# Patient Record
Sex: Female | Born: 1985 | Hispanic: Yes | Marital: Married | Smoking: Never smoker
Health system: Southern US, Community
[De-identification: ages and names within clinical notes are randomized; demographics above are authoritative.]

## PROBLEM LIST (undated history)

## (undated) DIAGNOSIS — E559 Vitamin D deficiency, unspecified: Secondary | ICD-10-CM

## (undated) DIAGNOSIS — K429 Umbilical hernia without obstruction or gangrene: Secondary | ICD-10-CM

---
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
## 2007-01-20 ENCOUNTER — Ambulatory Visit: Payer: Self-pay

## 2007-04-09 IMAGING — US US OB US >=[ID] SNGL FETUS
1 series · 17 of 18 positions shown · non-contrast
Comparison: none

REASON FOR EXAM: Dates
COMMENTS:

[Series 1: us ob us >=(id) sngl fetus · 17 of 18 slices shown]
[im 1/18]
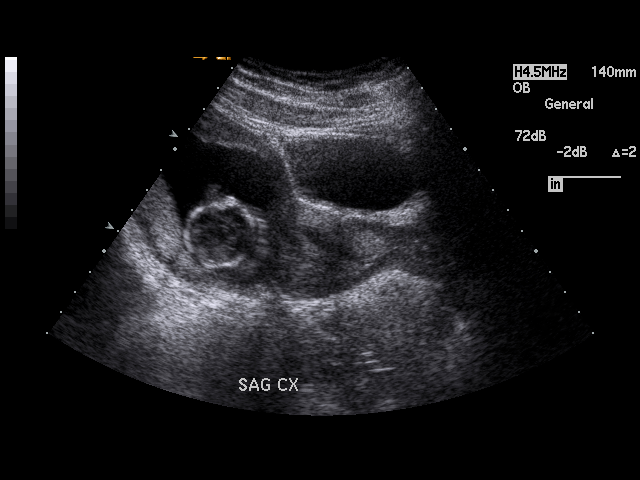
[im 2/18]
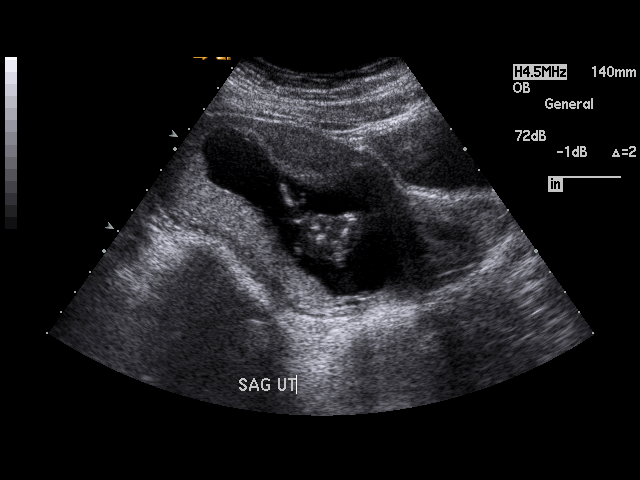
[im 3/18]
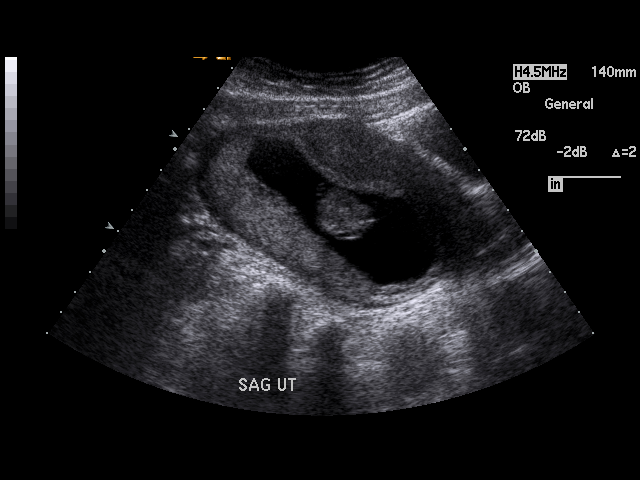
[im 4/18]
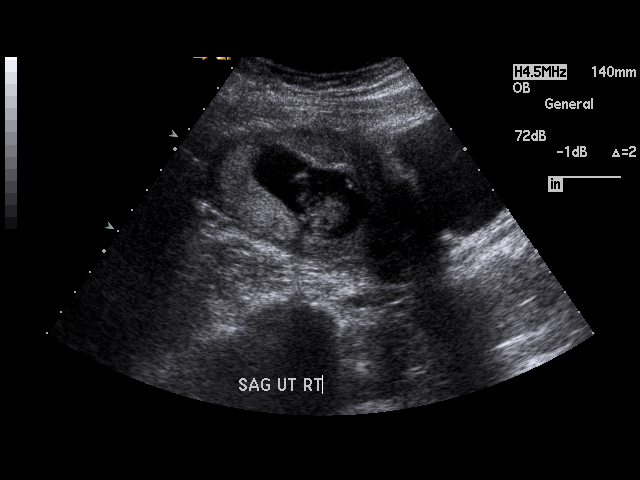
[im 5/18]
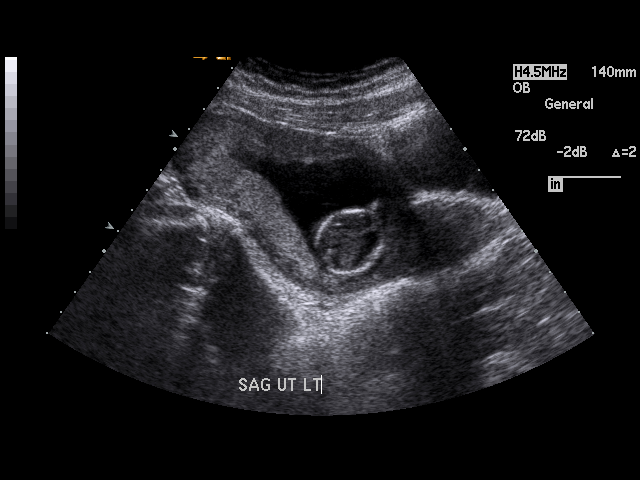
[im 6/18]
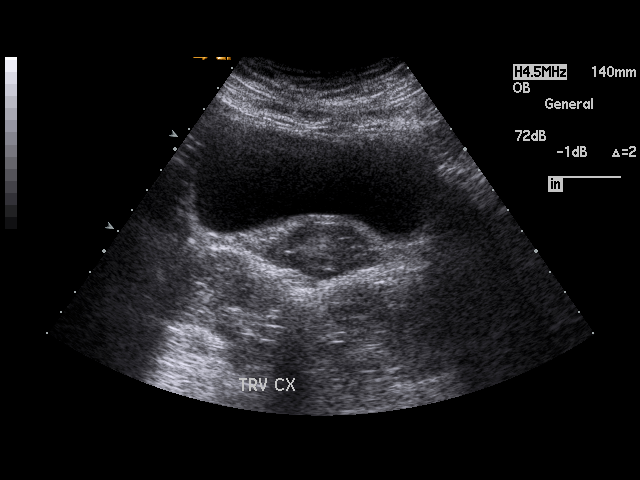
[im 7/18]
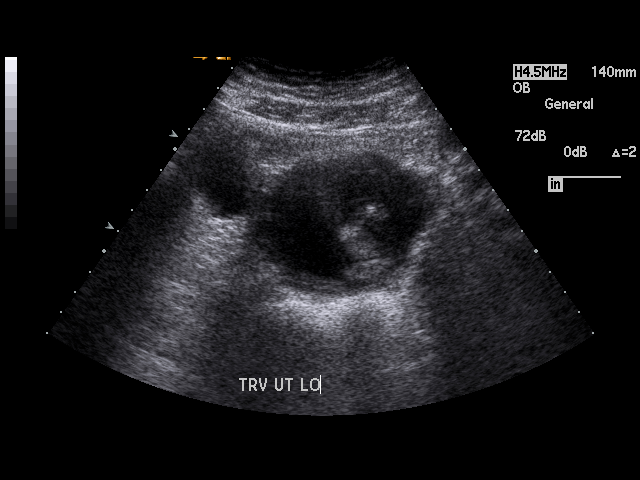
[im 8/18]
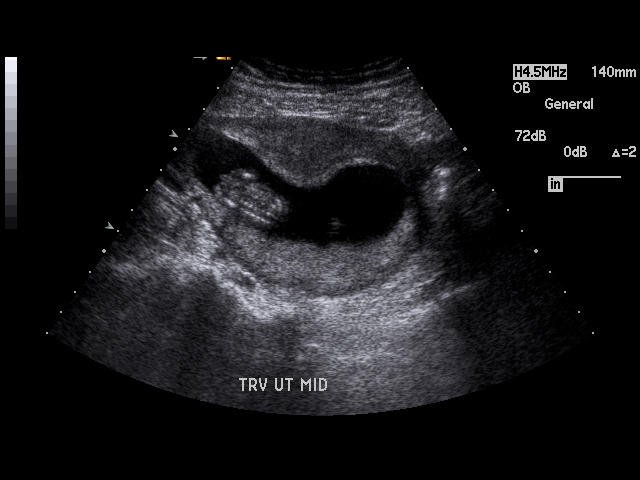
[im 10/18]
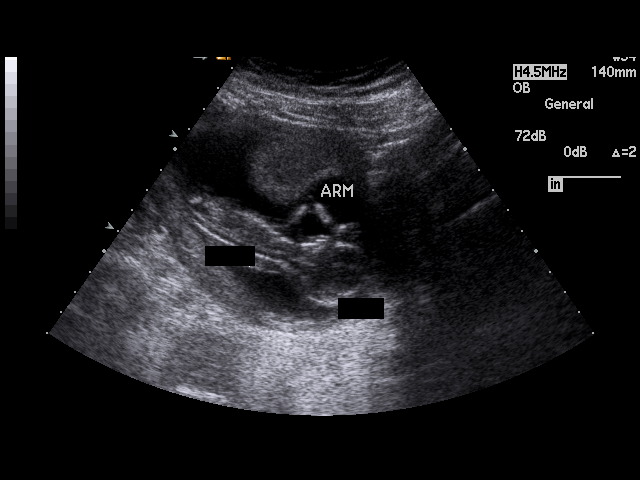
[im 11/18]
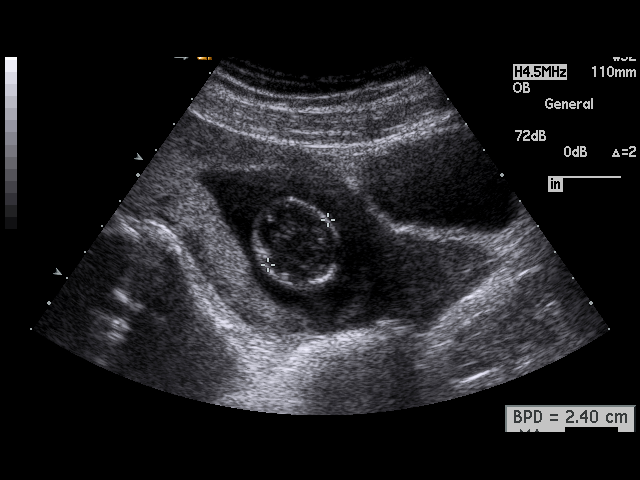
[im 12/18]
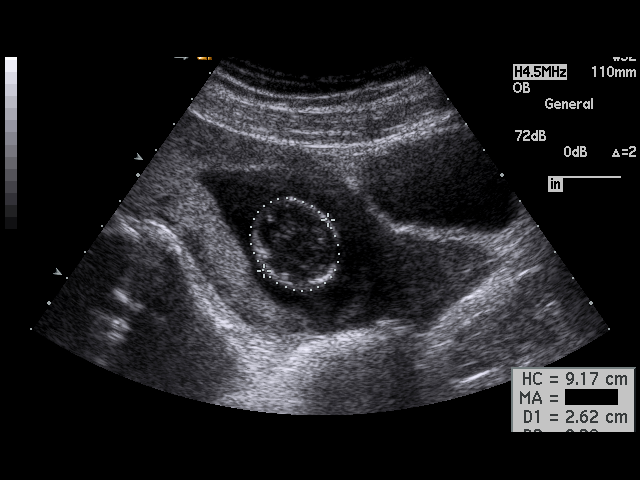
[im 13/18]
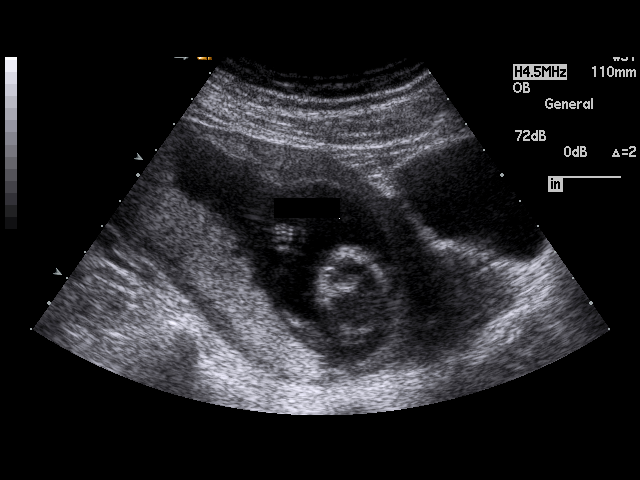
[im 14/18]
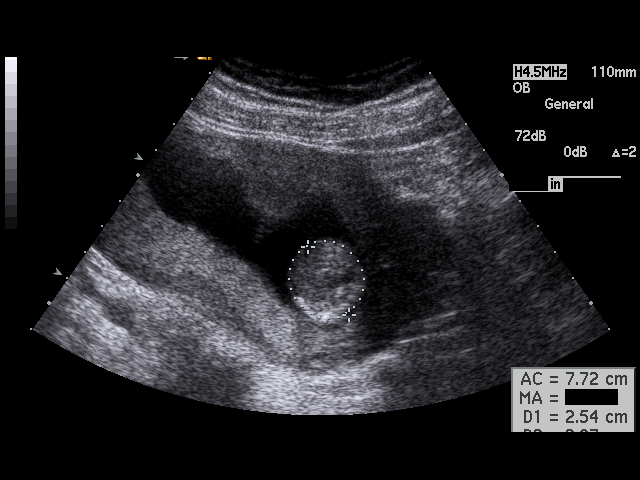
[im 15/18]
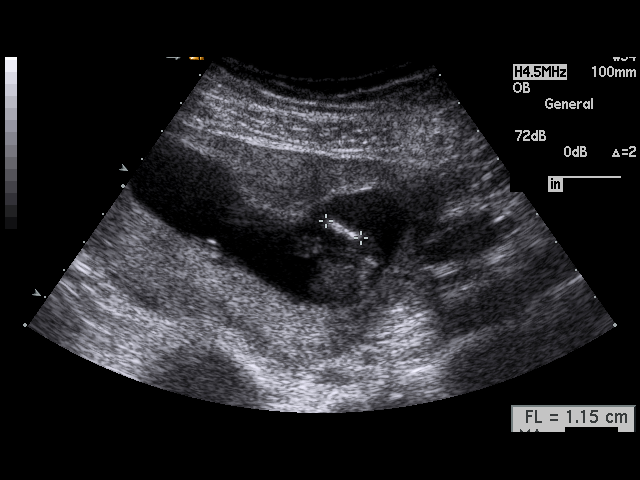
[im 16/18]
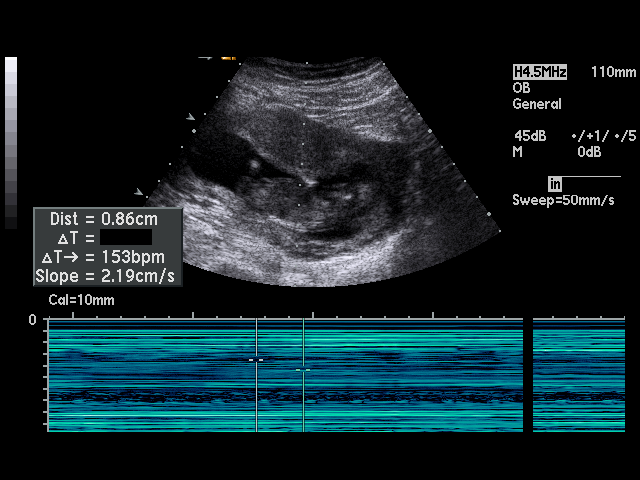
[im 17/18]
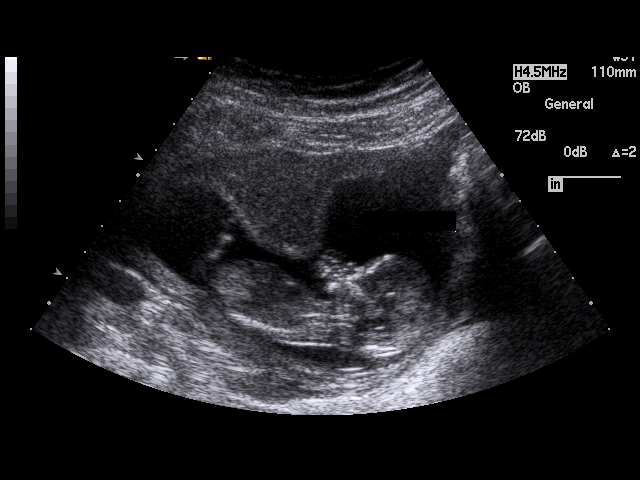
[im 18/18]
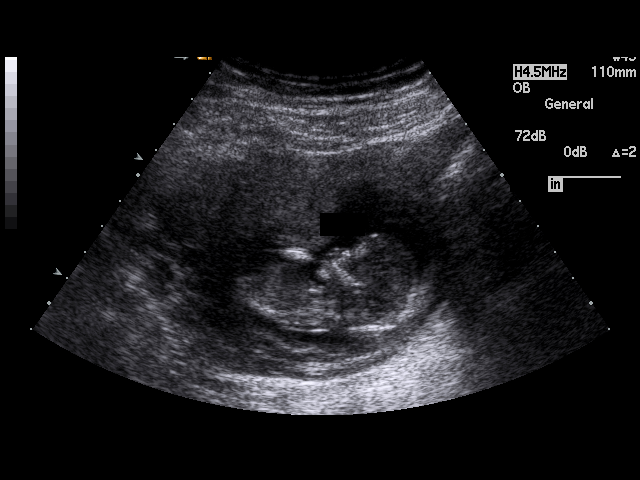

[17 of 18 positions shown; findings below may reference images not displayed]

PROCEDURE:     US  - US PREGNANCY / FETAL AGE  - November [birth year], 2004  [DATE]

RESULT:     There is observed a viable intrauterine gestation.  Fetal parts
are too small to evaluate at this time.  The embryo heart rate was monitored
at 153 beats per minute.

The fetal measurements are as follows:
BPD 2.40 cm (14 weeks 0 days)
HC 9.17 cm (14 weeks 1 day)
AC 7.72 cm (14 weeks 1 day)
FL 1.15 cm (13 weeks 3 days)
EFW equal 102 grams.
Ultrasound EDD is 05-15-05.
IMPRESSION: Please see above.

## 2012-03-14 ENCOUNTER — Emergency Department: Payer: Self-pay | Admitting: Emergency Medicine

## 2012-03-14 LAB — URINALYSIS, COMPLETE
Bacteria: NONE SEEN
Bilirubin,UR: NEGATIVE
Glucose,UR: NEGATIVE mg/dL (ref 0–75)
Leukocyte Esterase: NEGATIVE
Nitrite: NEGATIVE
Squamous Epithelial: <1
WBC UR: NONE SEEN /HPF (ref 0–5)

## 2012-03-14 LAB — COMPREHENSIVE METABOLIC PANEL
Albumin: 4.1 g/dL (ref 3.4–5.0)
Alkaline Phosphatase: 87 U/L (ref 50–136)
BUN: 11 mg/dL (ref 7–18)
Calcium, Total: 8.7 mg/dL (ref 8.5–10.1)
Co2: 26 mmol/L (ref 21–32)
Creatinine: 0.76 mg/dL (ref 0.60–1.30)
Osmolality: 280 (ref 275–301)
SGOT(AST): 27 U/L (ref 15–37)

## 2012-03-14 LAB — CBC
HGB: 13.6 g/dL (ref 12.0–16.0)
MCH: 33.1 pg (ref 26.0–34.0)
MCHC: 34.6 g/dL (ref 32.0–36.0)
Platelet: 290 10*3/uL (ref 150–440)
RBC: 4.11 10*6/uL (ref 3.80–5.20)
RDW: 12.3 % (ref 11.5–14.5)

## 2012-03-14 LAB — HCG, QUANTITATIVE, PREGNANCY: Beta Hcg, Quant.: 24 m[IU]/mL — ABNORMAL HIGH

## 2012-03-14 LAB — PREGNANCY, URINE: Pregnancy Test, Urine: NEGATIVE m[IU]/mL

## 2012-09-30 ENCOUNTER — Emergency Department: Payer: Self-pay | Admitting: Emergency Medicine

## 2012-09-30 LAB — URINALYSIS, COMPLETE
Glucose,UR: NEGATIVE mg/dL (ref 0–75)
Ketone: NEGATIVE
Leukocyte Esterase: NEGATIVE
Specific Gravity: 1.018 (ref 1.003–1.030)
Squamous Epithelial: 4
WBC UR: 1 /HPF (ref 0–5)

## 2012-09-30 LAB — WET PREP, GENITAL

## 2012-09-30 LAB — COMPREHENSIVE METABOLIC PANEL
Alkaline Phosphatase: 81 U/L (ref 50–136)
BUN: 14 mg/dL (ref 7–18)
Chloride: 109 mmol/L — ABNORMAL HIGH (ref 98–107)
EGFR (African American): >60
EGFR (Non-African Amer.): >60
Glucose: 89 mg/dL (ref 65–99)
Osmolality: 274 (ref 275–301)
Sodium: 137 mmol/L (ref 136–145)
Total Protein: 7.6 g/dL (ref 6.4–8.2)

## 2012-09-30 LAB — CBC
MCH: 32.9 pg (ref 26.0–34.0)
MCV: 93 fL (ref 80–100)
Platelet: 286 10*3/uL (ref 150–440)
RBC: 4.52 10*6/uL (ref 3.80–5.20)
RDW: 12.9 % (ref 11.5–14.5)
WBC: 13.2 10*3/uL — ABNORMAL HIGH (ref 3.6–11.0)

## 2012-09-30 LAB — GC/CHLAMYDIA PROBE AMP

## 2013-11-06 ENCOUNTER — Ambulatory Visit: Payer: Self-pay | Admitting: Family Medicine

## 2014-03-26 ENCOUNTER — Observation Stay: Payer: Self-pay | Admitting: Obstetrics and Gynecology

## 2014-04-07 ENCOUNTER — Inpatient Hospital Stay: Payer: Self-pay | Admitting: Obstetrics and Gynecology

## 2014-04-07 ENCOUNTER — Observation Stay: Payer: Self-pay | Admitting: Obstetrics and Gynecology

## 2014-04-07 LAB — CBC WITH DIFFERENTIAL/PLATELET
BASOS ABS: 0 10*3/uL (ref 0.0–0.1)
Basophil %: 0.4 %
EOS ABS: 0.1 10*3/uL (ref 0.0–0.7)
Eosinophil %: 0.7 %
HCT: 39.6 % (ref 35.0–47.0)
HGB: 13.1 g/dL (ref 12.0–16.0)
LYMPHS ABS: 2.3 10*3/uL (ref 1.0–3.6)
Lymphocyte %: 18.9 %
MCH: 31 pg (ref 26.0–34.0)
MCHC: 33.2 g/dL (ref 32.0–36.0)
MCV: 94 fL (ref 80–100)
MONO ABS: 0.7 x10 3/mm (ref 0.2–0.9)
Monocyte %: 5.9 %
Neutrophil #: 9.1 10*3/uL — ABNORMAL HIGH (ref 1.4–6.5)
Neutrophil %: 74.1 %
PLATELETS: 177 10*3/uL (ref 150–440)
RBC: 4.23 10*6/uL (ref 3.80–5.20)
RDW: 14.5 % (ref 11.5–14.5)
WBC: 12.2 10*3/uL — ABNORMAL HIGH (ref 3.6–11.0)

## 2014-04-09 LAB — HEMOGLOBIN: HGB: 12.4 g/dL (ref 12.0–16.0)

## 2014-08-09 ENCOUNTER — Emergency Department
Admit: 2014-08-09 | Disposition: A | Discharge status: Home / Self Care | Payer: Self-pay | Admitting: Emergency Medicine

## 2015-06-03 ENCOUNTER — Encounter: Payer: Self-pay | Admitting: Medical Oncology

## 2015-06-03 ENCOUNTER — Emergency Department
Admission: EM | Admit: 2015-06-03 | Discharge: 2015-06-03 | Disposition: A | Discharge status: Home / Self Care | Payer: Self-pay | Attending: Emergency Medicine | Admitting: Emergency Medicine

## 2015-06-03 DIAGNOSIS — R1110 Vomiting, unspecified: Secondary | ICD-10-CM | POA: Insufficient documentation

## 2015-06-03 DIAGNOSIS — R197 Diarrhea, unspecified: Secondary | ICD-10-CM | POA: Insufficient documentation

## 2015-06-03 DIAGNOSIS — R52 Pain, unspecified: Secondary | ICD-10-CM | POA: Insufficient documentation

## 2015-06-03 LAB — CBC
HEMATOCRIT: 42.9 % (ref 35.0–47.0)
Hemoglobin: 15 g/dL (ref 12.0–16.0)
MCH: 32.1 pg (ref 26.0–34.0)
MCHC: 35 g/dL (ref 32.0–36.0)
MCV: 91.8 fL (ref 80.0–100.0)
Platelets: 270 10*3/uL (ref 150–440)
RBC: 4.68 MIL/uL (ref 3.80–5.20)
RDW: 13 % (ref 11.5–14.5)
WBC: 16.7 10*3/uL — AB (ref 3.6–11.0)

## 2015-06-03 LAB — URINALYSIS COMPLETE WITH MICROSCOPIC (ARMC ONLY)
BACTERIA UA: NONE SEEN
Bilirubin Urine: NEGATIVE
GLUCOSE, UA: NEGATIVE mg/dL
Ketones, ur: NEGATIVE mg/dL
LEUKOCYTES UA: NEGATIVE
Nitrite: NEGATIVE
PH: 5 (ref 5.0–8.0)
Protein, ur: NEGATIVE mg/dL
Specific Gravity, Urine: 1.025 (ref 1.005–1.030)

## 2015-06-03 LAB — COMPREHENSIVE METABOLIC PANEL
ALBUMIN: 4.4 g/dL (ref 3.5–5.0)
ALT: 25 U/L (ref 14–54)
AST: 22 U/L (ref 15–41)
Alkaline Phosphatase: 83 U/L (ref 38–126)
Anion gap: 7 (ref 5–15)
BUN: 13 mg/dL (ref 6–20)
CHLORIDE: 108 mmol/L (ref 101–111)
CO2: 22 mmol/L (ref 22–32)
Calcium: 8.8 mg/dL — ABNORMAL LOW (ref 8.9–10.3)
Creatinine, Ser: 0.6 mg/dL (ref 0.44–1.00)
GFR calc Af Amer: >60 mL/min (ref >60)
GFR calc non Af Amer: >60 mL/min (ref >60)
GLUCOSE: 113 mg/dL — AB (ref 65–99)
POTASSIUM: 3.8 mmol/L (ref 3.5–5.1)
SODIUM: 137 mmol/L (ref 135–145)
Total Bilirubin: 0.8 mg/dL (ref 0.3–1.2)
Total Protein: 7.7 g/dL (ref 6.5–8.1)

## 2015-06-03 LAB — LIPASE, BLOOD: Lipase: 17 U/L (ref 11–51)

## 2015-06-03 MED ORDER — ONDANSETRON 4 MG PO TBDP
4.0000 mg | ORAL_TABLET | Freq: Once | ORAL | Status: AC | PRN
  Administered 2015-06-03: 4 mg via ORAL
  Filled 2015-06-03: qty 1

## 2015-06-03 NOTE — ED Notes (Signed)
Pt reports that she began yesterday with vomiting and diarrhea and body aches.

## 2015-06-03 NOTE — ED Notes (Signed)
Pt wants to leave without being seen so she can take her child home.

## 2016-01-13 ENCOUNTER — Ambulatory Visit
Admission: RE | Admit: 2016-01-13 | Discharge: 2016-01-13 | Disposition: A | Discharge status: Home / Self Care | Payer: Self-pay | Source: Ambulatory Visit | Attending: Family Medicine | Admitting: Family Medicine

## 2016-01-13 ENCOUNTER — Other Ambulatory Visit (HOSPITAL_COMMUNITY): Payer: Self-pay | Admitting: Family Medicine

## 2016-01-13 DIAGNOSIS — R7612 Nonspecific reaction to cell mediated immunity measurement of gamma interferon antigen response without active tuberculosis: Secondary | ICD-10-CM

## 2016-01-13 DIAGNOSIS — R918 Other nonspecific abnormal finding of lung field: Secondary | ICD-10-CM | POA: Insufficient documentation

## 2016-12-23 ENCOUNTER — Encounter: Payer: Self-pay | Admitting: Emergency Medicine

## 2016-12-23 ENCOUNTER — Emergency Department: Payer: 59

## 2016-12-23 ENCOUNTER — Emergency Department
Admission: EM | Admit: 2016-12-23 | Discharge: 2016-12-23 | Disposition: A | Discharge status: Home / Self Care | Payer: 59 | Attending: Emergency Medicine | Admitting: Emergency Medicine

## 2016-12-23 DIAGNOSIS — R197 Diarrhea, unspecified: Secondary | ICD-10-CM | POA: Insufficient documentation

## 2016-12-23 DIAGNOSIS — Z79899 Other long term (current) drug therapy: Secondary | ICD-10-CM | POA: Diagnosis not present

## 2016-12-23 DIAGNOSIS — R1013 Epigastric pain: Secondary | ICD-10-CM | POA: Insufficient documentation

## 2016-12-23 DIAGNOSIS — R112 Nausea with vomiting, unspecified: Secondary | ICD-10-CM

## 2016-12-23 DIAGNOSIS — R1010 Upper abdominal pain, unspecified: Secondary | ICD-10-CM | POA: Diagnosis present

## 2016-12-23 LAB — URINALYSIS, COMPLETE (UACMP) WITH MICROSCOPIC
BACTERIA UA: NONE SEEN
Bilirubin Urine: NEGATIVE
Glucose, UA: NEGATIVE mg/dL
Hgb urine dipstick: NEGATIVE
Ketones, ur: NEGATIVE mg/dL
Leukocytes, UA: NEGATIVE
Nitrite: NEGATIVE
Protein, ur: NEGATIVE mg/dL
SPECIFIC GRAVITY, URINE: 1.015 (ref 1.005–1.030)
pH: 7 (ref 5.0–8.0)

## 2016-12-23 LAB — COMPREHENSIVE METABOLIC PANEL
ALBUMIN: 3.9 g/dL (ref 3.5–5.0)
ALK PHOS: 69 U/L (ref 38–126)
ALT: 32 U/L (ref 14–54)
ANION GAP: 7 (ref 5–15)
AST: 25 U/L (ref 15–41)
BUN: 11 mg/dL (ref 6–20)
CALCIUM: 9 mg/dL (ref 8.9–10.3)
CO2: 26 mmol/L (ref 22–32)
Chloride: 105 mmol/L (ref 101–111)
Creatinine, Ser: 0.78 mg/dL (ref 0.44–1.00)
GFR calc Af Amer: >60 mL/min (ref >60)
GFR calc non Af Amer: >60 mL/min (ref >60)
GLUCOSE: 103 mg/dL — AB (ref 65–99)
POTASSIUM: 3.6 mmol/L (ref 3.5–5.1)
SODIUM: 138 mmol/L (ref 135–145)
Total Bilirubin: 0.6 mg/dL (ref 0.3–1.2)
Total Protein: 7.1 g/dL (ref 6.5–8.1)

## 2016-12-23 LAB — CBC
HEMATOCRIT: 42 % (ref 35.0–47.0)
HEMOGLOBIN: 14.4 g/dL (ref 12.0–16.0)
MCH: 32.1 pg (ref 26.0–34.0)
MCHC: 34.2 g/dL (ref 32.0–36.0)
MCV: 94 fL (ref 80.0–100.0)
Platelets: 287 10*3/uL (ref 150–440)
RBC: 4.47 MIL/uL (ref 3.80–5.20)
RDW: 12.5 % (ref 11.5–14.5)
WBC: 11.7 10*3/uL — ABNORMAL HIGH (ref 3.6–11.0)

## 2016-12-23 LAB — LIPASE, BLOOD: Lipase: 18 U/L (ref 11–51)

## 2016-12-23 LAB — POCT PREGNANCY, URINE: PREG TEST UR: NEGATIVE

## 2016-12-23 MED ORDER — IOPAMIDOL (ISOVUE-300) INJECTION 61%
100.0000 mL | Freq: Once | INTRAVENOUS | Status: AC | PRN
  Administered 2016-12-23: 100 mL via INTRAVENOUS

## 2016-12-23 MED ORDER — SODIUM CHLORIDE 0.9 % IV BOLUS (SEPSIS)
1000.0000 mL | Freq: Once | INTRAVENOUS | Status: AC
  Administered 2016-12-23: 1000 mL via INTRAVENOUS

## 2016-12-23 MED ORDER — MORPHINE SULFATE (PF) 4 MG/ML IV SOLN
4.0000 mg | Freq: Once | INTRAVENOUS | Status: AC
  Administered 2016-12-23: 4 mg via INTRAVENOUS
  Filled 2016-12-23: qty 1

## 2016-12-23 MED ORDER — GI COCKTAIL ~~LOC~~
30.0000 mL | Freq: Once | ORAL | Status: AC
  Administered 2016-12-23: 30 mL via ORAL
  Filled 2016-12-23: qty 30

## 2016-12-23 MED ORDER — FENTANYL CITRATE (PF) 100 MCG/2ML IJ SOLN
50.0000 ug | Freq: Once | INTRAMUSCULAR | Status: DC
  Filled 2016-12-23: qty 2

## 2016-12-23 MED ORDER — IOPAMIDOL (ISOVUE-300) INJECTION 61%
30.0000 mL | Freq: Once | INTRAVENOUS | Status: AC | PRN
  Administered 2016-12-23: 30 mL via ORAL

## 2016-12-23 MED ORDER — ONDANSETRON HCL 4 MG/2ML IJ SOLN
4.0000 mg | Freq: Once | INTRAMUSCULAR | Status: AC
  Administered 2016-12-23: 4 mg via INTRAVENOUS
  Filled 2016-12-23: qty 2

## 2016-12-23 MED ORDER — FAMOTIDINE IN NACL 20-0.9 MG/50ML-% IV SOLN
20.0000 mg | Freq: Once | INTRAVENOUS | Status: AC
  Administered 2016-12-23: 20 mg via INTRAVENOUS
  Filled 2016-12-23: qty 50

## 2016-12-23 MED ORDER — ONDANSETRON 4 MG PO TBDP
4.0000 mg | ORAL_TABLET | Freq: Once | ORAL | Status: AC
  Administered 2016-12-23: 4 mg via ORAL
  Filled 2016-12-23: qty 1

## 2016-12-23 MED ORDER — LOPERAMIDE HCL 2 MG PO TABS: 2.0000 mg | ORAL_TABLET | Freq: Four times a day (QID) | ORAL | 0 refills | Status: DC | PRN

## 2016-12-23 MED ORDER — ONDANSETRON 4 MG PO TBDP: 4.0000 mg | ORAL_TABLET | Freq: Three times a day (TID) | ORAL | 0 refills | Status: DC | PRN

## 2016-12-23 NOTE — ED Triage Notes (Signed)
Pt reports sudden onset of upper abdominal pain with nausea vomiting and diarrhea that began today.

## 2016-12-23 NOTE — Discharge Instructions (Signed)
Please take a clear liquid diet for the next 24 hours and diet as tolerated.  Drink plenty of fluid to stay well hydrated.  You may take Tylenol or Motrin for pain. Zofran is for nausea and vomiting, and Loperamide is for diarrhea.  Return to the emergency department for severe pain, inability to keep down fluids, fever, or any other symptoms concerning to you.

## 2016-12-23 NOTE — ED Notes (Signed)
CT notified that pt is done drinking contrast

## 2016-12-23 NOTE — ED Provider Notes (Signed)
[facility] Emergency Department Provider Note  ____________________________________________   First MD Initiated Contact with Patient 12/23/16 1231     (approximate)  I have reviewed the triage vital signs and the nursing notes.   HISTORY  Chief Complaint Abdominal Pain; Emesis; and Diarrhea   HPI Katherine Berg is a 31 y.o. female with a history of acid reflux was presenting to the emergency department with sudden onset epigastric abdominal pain which is nonradiating. She says that the pain is minimal at this time but is coming and going and does get up to a sharp, 10 out of 10 pain at times. She says that it started earlier today. She has had nausea vomiting and diarrhea accompanying the pain. Does not report any blood in the vomitus or the stool. No known sick contacts.   History reviewed. No pertinent past medical history.  There are no active problems to display for this patient.   No past surgical history on file.  Prior to Admission medications   Medication Sig Start Date End Date Taking? Authorizing Provider  loperamide (IMODIUM A-D) 2 MG tablet Take 1 tablet (2 mg total) by mouth 4 (four) times daily as needed for diarrhea or loose stools. 12/23/16   Rockne Menghini, MD  ondansetron (ZOFRAN ODT) 4 MG disintegrating tablet Take 1 tablet (4 mg total) by mouth every 8 (eight) hours as needed for nausea or vomiting. 12/23/16   Rockne Menghini, MD    Allergies Patient has no known allergies.  No family history on file.  Social History Social History  Substance Use Topics  . Smoking status: Never Smoker  . Smokeless tobacco: Not on file  . Alcohol use Not on file    Review of Systems  Constitutional: No fever/chills Eyes: No visual changes. ENT: No sore throat. Cardiovascular: Denies chest pain. Respiratory: Denies shortness of breath. Gastrointestinal:   No constipation. Genitourinary: Negative for  dysuria. Musculoskeletal: Negative for back pain. Skin: Negative for rash. Neurological: Negative for headaches, focal weakness or numbness.   ____________________________________________   PHYSICAL EXAM:  VITAL SIGNS: ED Triage Vitals  Enc Vitals Group     BP 12/23/16 1124 (!) 127/110     Pulse Rate 12/23/16 1124 88     Resp 12/23/16 1124 18     Temp 12/23/16 1124 98.3 F (36.8 C)     Temp Source 12/23/16 1124 Oral     SpO2 12/23/16 1124 98 %     Weight 12/23/16 1124 150 lb (68 kg)     Height 12/23/16 1124 5\' 1"  (1.549 m)     Head Circumference --      Peak Flow --      Pain Score 12/23/16 1123 10     Pain Loc --      Pain Edu? --      Excl. in GC? --     Constitutional: Alert and oriented. Well appearing and in no acute distress. Eyes: Conjunctivae are normal.  Head: Atraumatic. Nose: No congestion/rhinnorhea. Mouth/Throat: Mucous membranes are moist.  Neck: No stridor.   Cardiovascular: Normal rate, regular rhythm. Grossly normal heart sounds.  Good peripheral circulation. Respiratory: Normal respiratory effort.  No retractions. Lungs CTAB. Gastrointestinal: Soft With mild to moderate tenderness across the upper abdomen with a negative Murphy sign. No rebound or guarding. No distention. No CVA tenderness. Musculoskeletal: No lower extremity tenderness nor edema.  No joint effusions. Neurologic:  Normal speech and language. No gross focal neurologic deficits are appreciated. Skin:  Skin is warm, dry and intact. No rash noted. Psychiatric: Mood and affect are normal. Speech and behavior are normal.  ____________________________________________   LABS (all labs ordered are listed, but only abnormal results are displayed)  Labs Reviewed  COMPREHENSIVE METABOLIC PANEL - Abnormal; Notable for the following:       Result Value   Glucose, Bld 103 (*)    All other components within normal limits  CBC - Abnormal; Notable for the following:    WBC 11.7 (*)    All  other components within normal limits  URINALYSIS, COMPLETE (UACMP) WITH MICROSCOPIC - Abnormal; Notable for the following:    Color, Urine YELLOW (*)    APPearance CLEAR (*)    Squamous Epithelial / LPF 0-5 (*)    All other components within normal limits  LIPASE, BLOOD  POCT PREGNANCY, URINE   ____________________________________________  EKG  ED ECG REPORT I, [name],  Chaunte Hornbeck M, the attending physician, personally viewed and interpreted this ECG.   Date: 12/23/2016  EKG Time: 1517  Rate: 59  Rhythm: normal sinus rhythm  Axis: Normal  Intervals:none  ST&T Change: No ST segment elevation or depression. No abnormal T-wave inversion.  ____________________________________________  RADIOLOGY  No acute finding on the abdomen and pelvis CT. ____________________________________________   PROCEDURES  Procedure(s) performed:   Procedures  Critical Care performed:   ____________________________________________   INITIAL IMPRESSION / ASSESSMENT AND PLAN / ED COURSE  Pertinent labs & imaging results that were available during my care of the patient were reviewed by me and considered in my medical decision making (see chart for details).  ----------------------------------------- 3:15 PM on 12/23/2016 -----------------------------------------  Patient able to tolerate by mouth contrast but still says she is having epigastric pain. We will recheck her EKG as well as try a GI cocktail. Signed out to Dr. Sharma CovertNorman.      ____________________________________________   FINAL CLINICAL IMPRESSION(S) / ED DIAGNOSES  Final diagnoses:  Epigastric pain  Nausea vomiting and diarrhea      NEW MEDICATIONS STARTED DURING THIS VISIT:  New Prescriptions   LOPERAMIDE (IMODIUM A-D) 2 MG TABLET    Take 1 tablet (2 mg total) by mouth 4 (four) times daily as needed for diarrhea or loose stools.   ONDANSETRON (ZOFRAN ODT) 4 MG DISINTEGRATING TABLET    Take 1 tablet (4 mg total) by  mouth every 8 (eight) hours as needed for nausea or vomiting.     Note:  This document was prepared using Dragon voice recognition software and may include unintentional dictation errors.     Myrna BlazerSchaevitz, Amanie Mcculley Matthew, MD 12/23/16 1540

## 2017-12-14 ENCOUNTER — Other Ambulatory Visit: Payer: Self-pay | Admitting: Chiropractor

## 2017-12-14 ENCOUNTER — Ambulatory Visit
Admission: RE | Admit: 2017-12-14 | Discharge: 2017-12-14 | Disposition: A | Discharge status: Home / Self Care | Payer: 59 | Source: Ambulatory Visit | Attending: Chiropractor | Admitting: Chiropractor

## 2017-12-14 DIAGNOSIS — T1490XA Injury, unspecified, initial encounter: Secondary | ICD-10-CM | POA: Insufficient documentation

## 2017-12-14 DIAGNOSIS — M4184 Other forms of scoliosis, thoracic region: Secondary | ICD-10-CM | POA: Insufficient documentation

## 2018-03-19 ENCOUNTER — Emergency Department: Payer: 59

## 2018-03-19 ENCOUNTER — Encounter: Payer: Self-pay | Admitting: Emergency Medicine

## 2018-03-19 ENCOUNTER — Observation Stay
Admission: EM | Admit: 2018-03-19 | Discharge: 2018-03-22 | Disposition: A | Discharge status: Home / Self Care | Payer: 59 | Attending: Specialist | Admitting: Specialist

## 2018-03-19 DIAGNOSIS — K2950 Unspecified chronic gastritis without bleeding: Secondary | ICD-10-CM | POA: Insufficient documentation

## 2018-03-19 DIAGNOSIS — K3189 Other diseases of stomach and duodenum: Secondary | ICD-10-CM | POA: Insufficient documentation

## 2018-03-19 DIAGNOSIS — E876 Hypokalemia: Secondary | ICD-10-CM | POA: Insufficient documentation

## 2018-03-19 DIAGNOSIS — R112 Nausea with vomiting, unspecified: Secondary | ICD-10-CM | POA: Diagnosis not present

## 2018-03-19 DIAGNOSIS — R1012 Left upper quadrant pain: Principal | ICD-10-CM | POA: Insufficient documentation

## 2018-03-19 DIAGNOSIS — R739 Hyperglycemia, unspecified: Secondary | ICD-10-CM | POA: Diagnosis not present

## 2018-03-19 DIAGNOSIS — R109 Unspecified abdominal pain: Secondary | ICD-10-CM

## 2018-03-19 DIAGNOSIS — B9681 Helicobacter pylori [H. pylori] as the cause of diseases classified elsewhere: Secondary | ICD-10-CM | POA: Insufficient documentation

## 2018-03-19 DIAGNOSIS — K429 Umbilical hernia without obstruction or gangrene: Secondary | ICD-10-CM | POA: Insufficient documentation

## 2018-03-19 DIAGNOSIS — Z800 Family history of malignant neoplasm of digestive organs: Secondary | ICD-10-CM | POA: Insufficient documentation

## 2018-03-19 DIAGNOSIS — R52 Pain, unspecified: Secondary | ICD-10-CM

## 2018-03-19 DIAGNOSIS — R1013 Epigastric pain: Secondary | ICD-10-CM

## 2018-03-19 DIAGNOSIS — K2900 Acute gastritis without bleeding: Secondary | ICD-10-CM

## 2018-03-19 DIAGNOSIS — D72829 Elevated white blood cell count, unspecified: Secondary | ICD-10-CM | POA: Diagnosis not present

## 2018-03-19 LAB — CBC
HEMATOCRIT: 40.1 % (ref 36.0–46.0)
Hemoglobin: 14.2 g/dL (ref 12.0–15.0)
MCH: 32.7 pg (ref 26.0–34.0)
MCHC: 35.4 g/dL (ref 30.0–36.0)
MCV: 92.4 fL (ref 80.0–100.0)
NRBC: 0 % (ref 0.0–0.2)
Platelets: 330 10*3/uL (ref 150–400)
RBC: 4.34 MIL/uL (ref 3.87–5.11)
RDW: 12.1 % (ref 11.5–15.5)
WBC: 17.8 10*3/uL — ABNORMAL HIGH (ref 4.0–10.5)

## 2018-03-19 LAB — COMPREHENSIVE METABOLIC PANEL
ALBUMIN: 4 g/dL (ref 3.5–5.0)
ALT: 19 U/L (ref 0–44)
ANION GAP: 8 (ref 5–15)
AST: 23 U/L (ref 15–41)
Alkaline Phosphatase: 67 U/L (ref 38–126)
BUN: 14 mg/dL (ref 6–20)
CHLORIDE: 108 mmol/L (ref 98–111)
CO2: 23 mmol/L (ref 22–32)
Calcium: 8.7 mg/dL — ABNORMAL LOW (ref 8.9–10.3)
Creatinine, Ser: 0.69 mg/dL (ref 0.44–1.00)
GFR calc Af Amer: >60 mL/min (ref >60)
GFR calc non Af Amer: >60 mL/min (ref >60)
GLUCOSE: 111 mg/dL — AB (ref 70–99)
POTASSIUM: 3.1 mmol/L — AB (ref 3.5–5.1)
SODIUM: 139 mmol/L (ref 135–145)
Total Bilirubin: 0.4 mg/dL (ref 0.3–1.2)
Total Protein: 7.2 g/dL (ref 6.5–8.1)

## 2018-03-19 LAB — POCT PREGNANCY, URINE: Preg Test, Ur: NEGATIVE

## 2018-03-19 LAB — LIPASE, BLOOD: Lipase: 32 U/L (ref 11–51)

## 2018-03-19 MED ORDER — IOPAMIDOL (ISOVUE-300) INJECTION 61%
30.0000 mL | Freq: Once | INTRAVENOUS | Status: AC | PRN
  Administered 2018-03-19: 30 mL via ORAL

## 2018-03-19 MED ORDER — ONDANSETRON HCL 4 MG/2ML IJ SOLN
4.0000 mg | Freq: Once | INTRAMUSCULAR | Status: AC
  Administered 2018-03-19: 4 mg via INTRAVENOUS
  Filled 2018-03-19: qty 2

## 2018-03-19 MED ORDER — MORPHINE SULFATE (PF) 4 MG/ML IV SOLN
4.0000 mg | Freq: Once | INTRAVENOUS | Status: AC
  Administered 2018-03-19: 4 mg via INTRAVENOUS
  Filled 2018-03-19: qty 1

## 2018-03-19 MED ORDER — SODIUM CHLORIDE 0.9 % IV SOLN
1000.0000 mL | Freq: Once | INTRAVENOUS | Status: AC
  Administered 2018-03-19: 1000 mL via INTRAVENOUS

## 2018-03-19 MED ORDER — IOPAMIDOL (ISOVUE-300) INJECTION 61%
100.0000 mL | Freq: Once | INTRAVENOUS | Status: AC | PRN
  Administered 2018-03-19: 100 mL via INTRAVENOUS

## 2018-03-19 NOTE — ED Triage Notes (Signed)
Patient with complaint of upper abdominal pain and nausea that started on Tuesday. Patient states that the pain became worse today with vomiting.

## 2018-03-19 NOTE — ED Notes (Signed)
Pt states that she has been having abd pain since this past Tuesday but today the pain has gotten much worse, denies feeling like eating makes the pain worse, pt states that she has vomited some, pt is guarding her epigastric area and states that she is tender to light touch to the area, pt is grimacing with pain, and also reports some nausea. Pt states that she feels like she is having a hard time having bowel movements and significant other states that she always seems to take a very long time to have bowel movements, states that he has never known anyone to have such difficulty having BM's, states that recently it seems worse for her, pt also states awhile ago she did pass some blood in her stool, but not recently. Pt denies any hx of colitis, diverticulitis, chron's, IBS, or any other GI issues

## 2018-03-19 NOTE — ED Notes (Signed)
Pt taken to ct 

## 2018-03-19 NOTE — ED Provider Notes (Addendum)
----------------------------------------- 10:57 PM on 03/19/2018 -----------------------------------------   Assuming care from Dr. Cyril LoosenKinner.  In short, Katherine Berg is a 32 y.o. female with a chief complaint of LUQ abdominal pain.  Refer to the original H&P for additional details.  The current plan of care is to follow up labs and CT scan and reassess.   ----------------------------------------- 2:06 AM on 03/20/2018 -----------------------------------------  CT scan indicates gastritis/esophagitis, no other acute nor abnormal findings.  Labs notable for a leukocytosis of 17 and a potassium of 3.1 but otherwise normal lipase, LFTs, kidney function.  Urine pregnancy is negative and the urinalysis is normal.  Patient is still having some pain and received another dose of morphine 4 mg IV  and a GI cocktail but she is hemodynamically stable and watching TV.  Her abdomen remains tender to palpation primarily in the epigastrium and left upper quadrant.  No indication for a right upper quadrant ultrasound particular given the reassuring CT.  I have prescribed multiple medications as listed below to help with her gastritis.  She reports no history of regular alcohol use and limited NSAID use.  I encouraged her to take the medications and follow-up as soon as possible with gastroenterology as well as with her PCP.  I gave my usual and customary return precautions.  She understands and agrees with the plan.  I checked the [place] controlled substance database and she has no prescriptions listed within the last 2 years.   ----------------------------------------- 2:50 AM on 03/20/2018 -----------------------------------------  Patient was being prepared for discharge when she began vomiting again.  Pain is still severe.  She has received 3 doses of morphine and multiple doses of antiemetics and her symptoms are still not controlled.  She cannot go home at this time.  I will  discuss with the hospitalist for admission and GI consult.    FINAL CLINICAL IMPRESSION(S) / ED DIAGNOSES  Final diagnoses:  Acute gastritis, presence of bleeding unspecified, unspecified gastritis type  LUQ abdominal pain    Meds given in ED: Medications  oxyCODONE-acetaminophen (PERCOCET/ROXICET) 5-325 MG per tablet 2 tablet (has no administration in time range)  potassium chloride SA (K-DUR,KLOR-CON) CR tablet 40 mEq (has no administration in time range)  haloperidol lactate (HALDOL) injection 1 mg (has no administration in time range)  diphenhydrAMINE (BENADRYL) injection 25 mg (has no administration in time range)  morphine 2 MG/ML injection 2 mg (has no administration in time range)  morphine 4 MG/ML injection 4 mg (4 mg Intravenous Given 03/19/18 2258)  ondansetron (ZOFRAN) injection 4 mg (4 mg Intravenous Given 03/19/18 2259)  0.9 %  sodium chloride infusion (1,000 mLs Intravenous New Bag/Given 03/19/18 2258)  iopamidol (ISOVUE-300) 61 % injection 30 mL (30 mLs Oral Contrast Given 03/19/18 2241)  iopamidol (ISOVUE-300) 61 % injection 100 mL (100 mLs Intravenous Contrast Given 03/19/18 2356)  morphine 4 MG/ML injection 4 mg (4 mg Intravenous Given 03/20/18 0115)  alum & mag hydroxide-simeth (MAALOX/MYLANTA) 200-200-20 MG/5ML suspension 30 mL (30 mLs Oral Given 03/20/18 0115)    And  lidocaine (XYLOCAINE) 2 % viscous mouth solution 15 mL (15 mLs Oral Given 03/20/18 0115)      ED Discharge Orders         Ordered    omeprazole (PRILOSEC OTC) 20 MG tablet  Daily     03/20/18 0202    sucralfate (CARAFATE) 1 g tablet  4 times daily PRN     03/20/18 0202    HYDROcodone-acetaminophen (NORCO/VICODIN) 5-325 MG tablet  Every  4 hours PRN     03/20/18 0202    docusate sodium (COLACE) 100 MG capsule     03/20/18 0202    ondansetron (ZOFRAN) 4 MG tablet     03/20/18 0202    potassium chloride SA (KLOR-CON M20) 20 MEQ tablet  Daily     03/20/18 0205            Loleta Rose,  MD 03/20/18 0210    Loleta Rose, MD 03/20/18 [id number]    Loleta Rose, MD 03/20/18 [phone]

## 2018-03-19 NOTE — ED Provider Notes (Signed)
[facility] Emergency Department Provider Note   ____________________________________________    I have reviewed the triage vital signs and the nursing notes.   HISTORY  Chief Complaint Abdominal Pain     HPI Katherine Berg is a 32 y.o. female who presents with complaints of abdominal pain.  Patient reports 4 to 5 days of left-sided abdominal pain which has gradually worsened.  Today it is become far more severe.  She reports multiple episodes of nausea and vomiting, nonbilious, nonbloody.  No fevers or chills.  Has not taken anything for this.  Does report she had a painful bowel movement recently but no diarrhea.  No recent travel.   History reviewed. No pertinent past medical history.  There are no active problems to display for this patient.   History reviewed. No pertinent surgical history.  Prior to Admission medications   Medication Sig Start Date End Date Taking? Authorizing Provider  loperamide (IMODIUM A-D) 2 MG tablet Take 1 tablet (2 mg total) by mouth 4 (four) times daily as needed for diarrhea or loose stools. 12/23/16   Rockne Menghini, MD  ondansetron (ZOFRAN ODT) 4 MG disintegrating tablet Take 1 tablet (4 mg total) by mouth every 8 (eight) hours as needed for nausea or vomiting. 12/23/16   Rockne Menghini, MD     Allergies Patient has no known allergies.  No family history on file.  Social History Social History   Tobacco Use  . Smoking status: Never Smoker  . Smokeless tobacco: Never Used  Substance Use Topics  . Alcohol use: Yes    Comment: occ  . Drug use: Never    Review of Systems  Constitutional: No fever/chills Eyes: No visual changes.  ENT: No sore throat. Cardiovascular: Denies chest pain. Respiratory: Denies shortness of breath. Gastrointestinal: As above Genitourinary: Negative for dysuria. Musculoskeletal: Negative for back pain. Skin: Negative for rash. Neurological: Negative  for headaches or weakness   ____________________________________________   PHYSICAL EXAM:  VITAL SIGNS: ED Triage Vitals  Enc Vitals Group     BP 03/19/18 2218 (!) 97/57     Pulse Rate 03/19/18 2218 75     Resp 03/19/18 2218 20     Temp 03/19/18 2216 98.1 F (36.7 C)     Temp Source 03/19/18 2216 Oral     SpO2 03/19/18 2218 100 %     Weight 03/19/18 2216 68 kg (150 lb)     Height 03/19/18 2216 1.549 m (5\' 1" )     Head Circumference --      Peak Flow --      Pain Score 03/19/18 2216 9     Pain Loc --      Pain Edu? --      Excl. in GC? --     Constitutional: Alert and oriented.  Tearful uncomfortable Eyes: Conjunctivae are normal.   Nose: No congestion/rhinnorhea. Mouth/Throat: Mucous membranes are moist.    Cardiovascular: Normal rate, regular rhythm. Grossly normal heart sounds.  Good peripheral circulation. Respiratory: Normal respiratory effort.  No retractions. Lungs CTAB. Gastrointestinal: Soft, no distention, tender along the left upper quadrant, left lower quadrant, no CVA tenderness  Musculoskeletal:  Warm and well perfused Neurologic:  Normal speech and language. No gross focal neurologic deficits are appreciated.  Skin:  Skin is warm, dry and intact. No rash noted. Psychiatric: Mood and affect are normal. Speech and behavior are normal.  ____________________________________________   LABS (all labs ordered are listed, but only abnormal results are  displayed)  Labs Reviewed  CBC  COMPREHENSIVE METABOLIC PANEL  LIPASE, BLOOD  URINALYSIS, COMPLETE (UACMP) WITH MICROSCOPIC  POCT GASTRIC OCCULT BLOOD (1-CARD TO LAB)   ____________________________________________  EKG  None ____________________________________________  RADIOLOGY  CT abdomen pelvis pending ____________________________________________   PROCEDURES  Procedure(s) performed: No  Procedures   Critical Care performed: No ____________________________________________   INITIAL  IMPRESSION / ASSESSMENT AND PLAN / ED COURSE  Pertinent labs & imaging results that were available during my care of the patient were reviewed by me and considered in my medical decision making (see chart for details).  Patient presents with left-sided abdominal pain, left upper quadrant greater than left lower quadrant.  Does describe a slightly uncomfortable bowel movement earlier.  Somewhat suspicious for diverticulitis although she is young for that diagnosis.  Doubt ureterolithiasis, doubt UTI but certainly on the differential.  We will give IV morphine, IV Zofran, check labs, urinalysis and obtain CT abdomen pelvis given the length of symptoms and increase in her pain  I will asked my colleague to follow-up on labs and CT and reevaluate    ____________________________________________   FINAL CLINICAL IMPRESSION(S) / ED DIAGNOSES  Final diagnoses:  Left upper quadrant pain        Note:  This document was prepared using Dragon voice recognition software and may include unintentional dictation errors.    Katherine Berg, Jesse Nosbisch, MD 03/19/18 2236

## 2018-03-20 ENCOUNTER — Other Ambulatory Visit: Payer: Self-pay

## 2018-03-20 ENCOUNTER — Encounter: Payer: Self-pay | Admitting: Internal Medicine

## 2018-03-20 DIAGNOSIS — R1013 Epigastric pain: Secondary | ICD-10-CM | POA: Diagnosis not present

## 2018-03-20 DIAGNOSIS — R112 Nausea with vomiting, unspecified: Secondary | ICD-10-CM | POA: Diagnosis not present

## 2018-03-20 DIAGNOSIS — R933 Abnormal findings on diagnostic imaging of other parts of digestive tract: Secondary | ICD-10-CM

## 2018-03-20 LAB — HEMOGLOBIN A1C
HEMOGLOBIN A1C: 4.9 % (ref 4.8–5.6)
Mean Plasma Glucose: 93.93 mg/dL

## 2018-03-20 LAB — URINALYSIS, COMPLETE (UACMP) WITH MICROSCOPIC
BILIRUBIN URINE: NEGATIVE
Bacteria, UA: NONE SEEN
Glucose, UA: NEGATIVE mg/dL
Ketones, ur: NEGATIVE mg/dL
Leukocytes, UA: NEGATIVE
NITRITE: NEGATIVE
PROTEIN: NEGATIVE mg/dL
Specific Gravity, Urine: 1.008 (ref 1.005–1.030)
pH: 6 (ref 5.0–8.0)

## 2018-03-20 LAB — MAGNESIUM: Magnesium: 2 mg/dL (ref 1.7–2.4)

## 2018-03-20 LAB — PHOSPHORUS: Phosphorus: 2.7 mg/dL (ref 2.5–4.6)

## 2018-03-20 MED ORDER — POTASSIUM CHLORIDE 20 MEQ PO PACK
40.0000 meq | PACK | Freq: Two times a day (BID) | ORAL | Status: AC
  Administered 2018-03-20 (×2): 40 meq via ORAL
  Filled 2018-03-20 (×2): qty 2

## 2018-03-20 MED ORDER — TRAMADOL HCL 50 MG PO TABS: 100.0000 mg | ORAL_TABLET | Freq: Four times a day (QID) | ORAL | Status: DC | PRN

## 2018-03-20 MED ORDER — PROMETHAZINE HCL 25 MG/ML IJ SOLN
12.5000 mg | Freq: Once | INTRAMUSCULAR | Status: AC
  Administered 2018-03-20: 12.5 mg via INTRAVENOUS
  Filled 2018-03-20: qty 1

## 2018-03-20 MED ORDER — POTASSIUM CHLORIDE CRYS ER 20 MEQ PO TBCR: 20.0000 meq | EXTENDED_RELEASE_TABLET | Freq: Every day | ORAL | 0 refills | Status: DC

## 2018-03-20 MED ORDER — DOCUSATE SODIUM 100 MG PO CAPS: ORAL_CAPSULE | ORAL | 0 refills | Status: DC

## 2018-03-20 MED ORDER — LIDOCAINE VISCOUS HCL 2 % MT SOLN
15.0000 mL | Freq: Once | OROMUCOSAL | Status: AC
  Administered 2018-03-20: 15 mL via ORAL
  Filled 2018-03-20: qty 15

## 2018-03-20 MED ORDER — SENNOSIDES-DOCUSATE SODIUM 8.6-50 MG PO TABS: 1.0000 | ORAL_TABLET | Freq: Every evening | ORAL | Status: DC | PRN

## 2018-03-20 MED ORDER — OXYCODONE-ACETAMINOPHEN 5-325 MG PO TABS
2.0000 | ORAL_TABLET | Freq: Once | ORAL | Status: DC
  Filled 2018-03-20: qty 2

## 2018-03-20 MED ORDER — HALOPERIDOL LACTATE 5 MG/ML IJ SOLN
1.0000 mg | Freq: Once | INTRAMUSCULAR | Status: AC
  Administered 2018-03-20: 1 mg via INTRAVENOUS
  Filled 2018-03-20: qty 1

## 2018-03-20 MED ORDER — HYDROCODONE-ACETAMINOPHEN 5-325 MG PO TABS: 1.0000 | ORAL_TABLET | ORAL | 0 refills | Status: DC | PRN

## 2018-03-20 MED ORDER — SUCRALFATE 1 G PO TABS: 1.0000 g | ORAL_TABLET | Freq: Four times a day (QID) | ORAL | 1 refills | Status: DC | PRN

## 2018-03-20 MED ORDER — DIPHENHYDRAMINE HCL 50 MG/ML IJ SOLN
25.0000 mg | INTRAMUSCULAR | Status: AC
  Administered 2018-03-20: 25 mg via INTRAVENOUS
  Filled 2018-03-20: qty 1

## 2018-03-20 MED ORDER — ALUM & MAG HYDROXIDE-SIMETH 200-200-20 MG/5ML PO SUSP: 15.0000 mL | ORAL | Status: DC | PRN

## 2018-03-20 MED ORDER — DICYCLOMINE HCL 10 MG/5ML PO SOLN
10.0000 mg | Freq: Once | ORAL | Status: AC
  Administered 2018-03-20: 11:00:00 10 mg via ORAL
  Filled 2018-03-20 (×2): qty 5

## 2018-03-20 MED ORDER — POTASSIUM CHLORIDE CRYS ER 20 MEQ PO TBCR
40.0000 meq | EXTENDED_RELEASE_TABLET | Freq: Once | ORAL | Status: DC
  Filled 2018-03-20: qty 2

## 2018-03-20 MED ORDER — TRAMADOL HCL 50 MG PO TABS
50.0000 mg | ORAL_TABLET | Freq: Four times a day (QID) | ORAL | Status: DC | PRN
  Administered 2018-03-20: 50 mg via ORAL
  Filled 2018-03-20: qty 1

## 2018-03-20 MED ORDER — ALUM & MAG HYDROXIDE-SIMETH 200-200-20 MG/5ML PO SUSP
30.0000 mL | Freq: Once | ORAL | Status: AC
  Administered 2018-03-20: 30 mL via ORAL
  Filled 2018-03-20: qty 30

## 2018-03-20 MED ORDER — LIDOCAINE VISCOUS HCL 2 % MT SOLN
15.0000 mL | OROMUCOSAL | Status: AC
  Administered 2018-03-20: 16:00:00 15 mL via OROMUCOSAL
  Filled 2018-03-20: qty 15

## 2018-03-20 MED ORDER — MORPHINE SULFATE (PF) 4 MG/ML IV SOLN
4.0000 mg | Freq: Once | INTRAVENOUS | Status: AC
  Administered 2018-03-20: 4 mg via INTRAVENOUS
  Filled 2018-03-20: qty 1

## 2018-03-20 MED ORDER — ACETAMINOPHEN 325 MG PO TABS
650.0000 mg | ORAL_TABLET | Freq: Four times a day (QID) | ORAL | Status: DC | PRN
  Administered 2018-03-20: 20:00:00 650 mg via ORAL
  Filled 2018-03-20: qty 2

## 2018-03-20 MED ORDER — LACTATED RINGERS IV BOLUS
1000.0000 mL | Freq: Once | INTRAVENOUS | Status: AC
  Administered 2018-03-20: 04:00:00 1000 mL via INTRAVENOUS

## 2018-03-20 MED ORDER — MORPHINE SULFATE (PF) 2 MG/ML IV SOLN
2.0000 mg | Freq: Once | INTRAVENOUS | Status: DC
  Filled 2018-03-20: qty 1

## 2018-03-20 MED ORDER — POTASSIUM CHLORIDE 10 MEQ/100ML IV SOLN
10.0000 meq | INTRAVENOUS | Status: DC
  Administered 2018-03-20: 06:00:00 10 meq via INTRAVENOUS
  Filled 2018-03-20 (×7): qty 100

## 2018-03-20 MED ORDER — BISACODYL 5 MG PO TBEC: 5.0000 mg | DELAYED_RELEASE_TABLET | Freq: Every day | ORAL | Status: DC | PRN

## 2018-03-20 MED ORDER — ENOXAPARIN SODIUM 40 MG/0.4ML ~~LOC~~ SOLN
40.0000 mg | SUBCUTANEOUS | Status: DC
  Administered 2018-03-20 – 2018-03-21 (×2): 40 mg via SUBCUTANEOUS
  Filled 2018-03-20 (×3): qty 0.4

## 2018-03-20 MED ORDER — ONDANSETRON HCL 4 MG/2ML IJ SOLN
4.0000 mg | Freq: Four times a day (QID) | INTRAMUSCULAR | Status: DC | PRN
  Administered 2018-03-20 – 2018-03-21 (×3): 4 mg via INTRAVENOUS
  Filled 2018-03-20 (×3): qty 2

## 2018-03-20 MED ORDER — ONDANSETRON HCL 4 MG PO TABS: ORAL_TABLET | ORAL | 0 refills | Status: DC

## 2018-03-20 MED ORDER — SODIUM CHLORIDE 0.9 % IV SOLN
INTRAVENOUS | Status: DC
  Administered 2018-03-20: 16:00:00 via INTRAVENOUS

## 2018-03-20 MED ORDER — ACETAMINOPHEN 650 MG RE SUPP: 650.0000 mg | Freq: Four times a day (QID) | RECTAL | Status: DC | PRN

## 2018-03-20 MED ORDER — PANTOPRAZOLE SODIUM 40 MG IV SOLR
40.0000 mg | INTRAVENOUS | Status: DC
  Administered 2018-03-20 – 2018-03-22 (×3): 40 mg via INTRAVENOUS
  Filled 2018-03-20 (×3): qty 40

## 2018-03-20 MED ORDER — HYOSCYAMINE SULFATE 0.125 MG SL SUBL
0.2500 mg | SUBLINGUAL_TABLET | Freq: Once | SUBLINGUAL | Status: AC
  Administered 2018-03-20: 0.25 mg via SUBLINGUAL
  Filled 2018-03-20: qty 2

## 2018-03-20 MED ORDER — SUCRALFATE 1 GM/10ML PO SUSP
1.0000 g | Freq: Three times a day (TID) | ORAL | Status: DC
  Administered 2018-03-20 – 2018-03-21 (×3): 1 g via ORAL
  Filled 2018-03-20 (×8): qty 10

## 2018-03-20 MED ORDER — MORPHINE SULFATE (PF) 4 MG/ML IV SOLN: 4.0000 mg | Freq: Once | INTRAVENOUS | Status: DC

## 2018-03-20 MED ORDER — OMEPRAZOLE MAGNESIUM 20 MG PO TBEC: 20.0000 mg | DELAYED_RELEASE_TABLET | Freq: Every day | ORAL | 1 refills | Status: DC

## 2018-03-20 MED ORDER — PANTOPRAZOLE SODIUM 40 MG PO TBEC
40.0000 mg | DELAYED_RELEASE_TABLET | Freq: Every day | ORAL | Status: DC
  Administered 2018-03-20: 40 mg via ORAL
  Filled 2018-03-20: qty 1

## 2018-03-20 MED ORDER — MORPHINE SULFATE (PF) 4 MG/ML IV SOLN: 4.0000 mg | INTRAVENOUS | Status: DC | PRN

## 2018-03-20 MED ORDER — MORPHINE SULFATE (PF) 2 MG/ML IV SOLN
2.0000 mg | INTRAVENOUS | Status: DC | PRN
  Administered 2018-03-20: 2 mg via INTRAVENOUS

## 2018-03-20 MED ORDER — LIDOCAINE VISCOUS HCL 2 % MT SOLN
15.0000 mL | Freq: Once | OROMUCOSAL | Status: DC
  Filled 2018-03-20 (×2): qty 15

## 2018-03-20 NOTE — H&P (Signed)
[employer] - [place] [facility]   PATIENT NAME: Katherine Berg    MR#:  [id number]  DATE OF BIRTH:  1985/10/03  DATE OF ADMISSION:  03/19/2018  PRIMARY CARE PHYSICIAN: [facility]   REQUESTING/REFERRING PHYSICIAN: Loleta RoseForbach, Cory, MD  CHIEF COMPLAINT:   Chief Complaint  Patient presents with  . Abdominal Pain    HISTORY OF PRESENT ILLNESS:  Katherine Berg  is a 32 y.o. female with no known medical history p/w AP/N/V. Onset of symptoms Tuesday (11/19), LUQ + LLQ AP, crampy, intermittent, initially mild but progressively worsening over the course of the past week. Endorses decreased PO intake and nausea. States the pain does not correlate w/ PO intake or bowel movements. She endorses normal non-bloody bowel movements. She vomited once on Wednesday (11/20), but did not have any further vomiting until Sunday (11/24) afternoon. She denies Hx GERD/heartburn/reflux/acidity. She denies diarrhea. She states she does eat lots of spicy food. She continues to have N/V and AP in ED. Pt appears miserable, but is not in distress. She is accompanied by her husband Ladona Ridgelaylor, reachable at [phone].  PAST MEDICAL HISTORY:  History reviewed. No pertinent past medical history.  PAST SURGICAL HISTORY:  History reviewed. No pertinent surgical history.  SOCIAL HISTORY:   Social History   Tobacco Use  . Smoking status: Never Smoker  . Smokeless tobacco: Never Used  Substance Use Topics  . Alcohol use: Yes    Comment: occ    FAMILY HISTORY:  History reviewed. No pertinent family history.  DRUG ALLERGIES:  No Known Allergies  REVIEW OF SYSTEMS:   Review of Systems  Constitutional: Positive for malaise/fatigue. Negative for chills, diaphoresis, fever and weight loss.  HENT: Negative for congestion, ear pain, hearing loss, nosebleeds, sinus pain, sore throat and tinnitus.   Eyes: Negative for blurred vision, double vision  and photophobia.  Respiratory: Negative for cough, hemoptysis, sputum production, shortness of breath and wheezing.   Cardiovascular: Negative for chest pain, palpitations, orthopnea, claudication, leg swelling and PND.  Gastrointestinal: Positive for abdominal pain, nausea and vomiting. Negative for blood in stool, constipation, diarrhea, heartburn and melena.  Genitourinary: Negative for dysuria, frequency, hematuria and urgency.  Musculoskeletal: Negative for back pain, falls, joint pain, myalgias and neck pain.  Skin: Negative for itching and rash.  Neurological: Positive for weakness. Negative for dizziness, tingling, tremors, sensory change, speech change, focal weakness, seizures, loss of consciousness and headaches.  Psychiatric/Behavioral: Negative for memory loss. The patient does not have insomnia.    MEDICATIONS AT HOME:   Prior to Admission medications   Medication Sig Start Date End Date Taking? Authorizing Provider  docusate sodium (COLACE) 100 MG capsule Take 1 tablet once or twice daily as needed for constipation while taking narcotic pain medicine 03/20/18   Loleta RoseForbach, Cory, MD  HYDROcodone-acetaminophen (NORCO/VICODIN) 5-325 MG tablet Take 1-2 tablets by mouth every 4 (four) hours as needed for moderate pain. 03/20/18   Loleta RoseForbach, Cory, MD  omeprazole (PRILOSEC OTC) 20 MG tablet Take 1 tablet (20 mg total) by mouth daily. 03/20/18 03/20/19  Loleta RoseForbach, Cory, MD  ondansetron (ZOFRAN) 4 MG tablet Take 1-2 tabs by mouth every 8 hours as needed for nausea/vomiting 03/20/18   Loleta RoseForbach, Cory, MD  potassium chloride SA (KLOR-CON M20) 20 MEQ tablet Take 1 tablet (20 mEq total) by mouth daily. 03/20/18   Loleta RoseForbach, Cory, MD  sucralfate (CARAFATE) 1 g tablet Take 1 tablet (1 g total) by mouth 4 (four) times  daily as needed (for abdominal discomfort, nausea, and/or vomiting). 03/20/18   Loleta Rose, MD      VITAL SIGNS:  Blood pressure 117/66, pulse 85, temperature 98.1 F (36.7 C),  temperature source Oral, resp. rate (!) 24, height 5\' 1"  (1.549 m), weight 68 kg, last menstrual period 03/17/2018, SpO2 100 %.  PHYSICAL EXAMINATION:  Physical Exam  Constitutional: She is oriented to person, place, and time. She appears well-developed and well-nourished. She is active and cooperative.  Non-toxic appearance. She does not have a sickly appearance. She does not appear ill. No distress. She is not intubated.  HENT:  Head: Normocephalic and atraumatic.  Mouth/Throat: Oropharynx is clear and moist. No oropharyngeal exudate.  Eyes: Pupils are equal, round, and reactive to light. Conjunctivae, EOM and lids are normal. No scleral icterus.  Neck: Neck supple. No JVD present. No tracheal deviation present. No thyromegaly present.  Cardiovascular: Normal rate, regular rhythm, S1 normal, S2 normal and normal heart sounds.  No extrasystoles are present. Exam reveals no gallop, no S3, no S4, no distant heart sounds and no friction rub.  No murmur heard. Pulmonary/Chest: Effort normal and breath sounds normal. No accessory muscle usage or stridor. No apnea, no tachypnea and no bradypnea. She is not intubated. No respiratory distress. She has no decreased breath sounds. She has no wheezes. She has no rhonchi. She has no rales.  Abdominal: Soft. She exhibits no distension and no mass. Bowel sounds are decreased. There is tenderness in the epigastric area and left upper quadrant. There is no rigidity, no rebound and no guarding.  Musculoskeletal: Normal range of motion. She exhibits no edema or tenderness.  Neurological: She is alert and oriented to person, place, and time. She is not disoriented.  Skin: Skin is warm, dry and intact. No rash noted. She is not diaphoretic. No erythema.  Psychiatric: She has a normal mood and affect. Her speech is normal and behavior is normal. Judgment and thought content normal. Cognition and memory are normal.   LABORATORY PANEL:   CBC Recent Labs  Lab  03/19/18 2248  WBC 17.8*  HGB 14.2  HCT 40.1  PLT 330   ------------------------------------------------------------------------------------------------------------------  Chemistries  Recent Labs  Lab 03/19/18 2248  NA 139  K 3.1*  CL 108  CO2 23  GLUCOSE 111*  BUN 14  CREATININE 0.69  CALCIUM 8.7*  AST 23  ALT 19  ALKPHOS 67  BILITOT 0.4   ------------------------------------------------------------------------------------------------------------------  Cardiac Enzymes No results for input(s): TROPONINI in the last 168 hours. ------------------------------------------------------------------------------------------------------------------  RADIOLOGY:  Ct Abdomen Pelvis W Contrast  Result Date: 03/20/2018 CLINICAL DATA:  Abd pain, diverticulitis suspected. Patient reports abdominal pain for 5 days. EXAM: CT ABDOMEN AND PELVIS WITH CONTRAST TECHNIQUE: Multidetector CT imaging of the abdomen and pelvis was performed using the standard protocol following bolus administration of intravenous contrast. CONTRAST:  ISOVUE-300 IOPAMIDOL (ISOVUE-300) INJECTION 61% COMPARISON:  CT 12/23/2016 FINDINGS: Lower chest: Calcified granuloma in the right middle lobe. No acute airspace disease or pleural fluid. Air-fluid level in the distal esophagus. Hepatobiliary: No focal liver abnormality is seen. No gallstones, gallbladder wall thickening, or biliary dilatation. Pancreas: No ductal dilatation or inflammation. Spleen: Normal in size without focal abnormality. Adrenals/Urinary Tract: Normal adrenal glands. No hydronephrosis or perinephric edema. Homogeneous renal enhancement. Urinary bladder is nondistended but grossly unremarkable. Stomach/Bowel: Stomach is distended with ingested contrast. No gastric wall thickening. No bowel distension, inflammatory change or obstruction. Mild sigmoid colonic tortuosity without colonic wall thickening, inflammatory change or  evident mass. Normal  appendix, for example image 39 series 5. Vascular/Lymphatic: No significant vascular findings are present. No enlarged abdominal or pelvic lymph nodes. Reproductive: Uterus and bilateral adnexa are unremarkable. Tampon in the vagina. Other: Small to moderate fat containing umbilical hernia has slightly increased in size from prior CT. No inflammatory change or stranding. No abdominal free fluid or free air. 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 No acute osseous abnormalities. IMPRESSION: 1. Air-fluid level in distal esophagus can be seen with reflux or esophagitis/gastritis. No significant esophageal or gastric wall thickening. 2. No other acute findings in the abdomen or pelvis. 3. Fat containing umbilical hernia is slightly increased in size since CT 15 months ago, no inflammatory change or bowel involvement. Electronically Signed   By: Narda Rutherford M.D.   On: 03/20/2018 00:24   IMPRESSION AND PLAN:   A/P: 22F intractable epigastric/LUQ AP + N/V, CT A/P (+) reflux vs. Esophagitis/gastritis. Hypokalemia, hyperglycemia, hypocalcemia, leukocytosis. -Intractable epigastric/LUQ AP + N/V, reflux vs. esophagitis/gastritis: IVF, pain ctrl, antiemetics, Protonix, Carafate. H. Pylori breath test. Ulcer less likely but still in DDx. -Hypokalemia: Replete. Mag level pending. -Hyperglycemia, leukocytosis: Likely reactive, 2/2 N/V. HbA1c. -Hypocalcemia: Ionized calcium. -FEN/GI: NPO, ADAT (regular diet). -DVT PPx: Lovenox. -Code status: Full code. -Disposition: Observation, < 2 midnights.   All the records are reviewed and case discussed with ED provider. Management plans discussed with the patient, family and they are in agreement.  CODE STATUS: Full code.  TOTAL TIME TAKING CARE OF THIS PATIENT: 75 minutes.    Barbaraann Rondo M.D on 03/20/2018 at 3:19 AM  Between 7am to 6pm - Pager -  [phone]  After 6pm go to [email or web address] - Scientist, research (life sciences)  Hospitalists  Office  [phone]  CC: Primary care physician; [facility]   Note: This dictation was prepared with Nurse, children's dictation along with smaller phrase technology. Any transcriptional errors that result from this process are unintentional.

## 2018-03-20 NOTE — ED Notes (Signed)
Pt vomiting at this time, will let Dr York CeriseForbach know

## 2018-03-20 NOTE — Discharge Instructions (Signed)
Your work-up today was generally reassuring but it does appear you have a condition called gastritis which is causing the pain, nausea, and vomiting.  It is unclear what is causing it, but the medications prescribed should help you feel better.  It is important that you follow-up with Dr. Tobi BastosAnna or 1 of his gastroenterology colleagues; you may benefit from an upper endoscopy which is a kind of scope where they can look at your stomach and see if they find anything wrong.  Please avoid alcohol, spicy foods, NSAIDs (ibuprofen, naproxen, aspirin), and anything that makes your stomach feel worse.  We recommend you stick to a bland diet and drink plenty of fluids to stay hydrated.  Please follow-up as recommended and  return to the emergency department if you develop new or worsening symptoms that concern you.

## 2018-03-20 NOTE — ED Notes (Signed)
Pt states that after drinking the gi cocktail that "it feels funny but feels good on my stomach"

## 2018-03-20 NOTE — Consult Note (Addendum)
Katherine Bouillon, MD [address], [address], [place], [place], [address] [address], [address], [place], [place], [address] Phone: [phone]  Fax: [phone]  Consultation  Referring Provider:     Dr. Katheren Shams Primary Care Physician:  [facility] Reason for Consultation:     Epigastric pain, abnormal CT  Date of Admission:  03/19/2018 Date of Consultation:  03/20/2018         HPI:   Katherine Berg is a 32 y.o. female presented to the hospital due to 1 week history of midepigastric abdominal pain, cramping, nonradiating, 5 /10, intermittent.  Nausea and vomiting started yesterday night, with several episodes, and last episode was about 10 hours ago.  No hematemesis.  No prior history of similar symptoms.  Denies NSAID use however, reports taking over-the-counter generic NyQuil cough.  This may have had NSAIDs in it.  No prior EGD or colonoscopy.  Reports family history of colon cancer in her father who was diagnosed at 31 years of age.  No dysphagia    History reviewed. No pertinent past medical history.  History reviewed. No pertinent surgical history.  Prior to Admission medications   Medication Sig Start Date End Date Taking? Authorizing Provider  docusate sodium (COLACE) 100 MG capsule Take 1 tablet once or twice daily as needed for constipation while taking narcotic pain medicine 03/20/18   Loleta Rose, MD  HYDROcodone-acetaminophen (NORCO/VICODIN) 5-325 MG tablet Take 1-2 tablets by mouth every 4 (four) hours as needed for moderate pain. 03/20/18   Loleta Rose, MD  omeprazole (PRILOSEC OTC) 20 MG tablet Take 1 tablet (20 mg total) by mouth daily. 03/20/18 03/20/19  Loleta Rose, MD  ondansetron (ZOFRAN) 4 MG tablet Take 1-2 tabs by mouth every 8 hours as needed for nausea/vomiting 03/20/18   Loleta Rose, MD  potassium chloride SA (KLOR-CON M20) 20 MEQ tablet Take 1 tablet (20 mEq total) by mouth daily. 03/20/18   Loleta Rose, MD    sucralfate (CARAFATE) 1 g tablet Take 1 tablet (1 g total) by mouth 4 (four) times daily as needed (for abdominal discomfort, nausea, and/or vomiting). 03/20/18   Loleta Rose, MD    Family history: Colon cancer in father at age 31 years of age  Social History   Tobacco Use  . Smoking status: Never Smoker  . Smokeless tobacco: Never Used  Substance Use Topics  . Alcohol use: Yes    Comment: occ  . Drug use: Never    Allergies as of 03/19/2018  . (No Known Allergies)    Review of Systems:    All systems reviewed and negative except where noted in HPI.   Physical Exam:  Vital signs in last 24 hours: Vitals:   03/20/18 0245 03/20/18 0300 03/20/18 0315 03/20/18 0334  BP: (!) 113/47 117/66 (!) 108/58 93/65  Pulse: 61 85 (!) 57 73  Resp: (!) 21 (!) 24 19   Temp:    98.1 F (36.7 C)  TempSrc:    Oral  SpO2: 99% 100% 95% 98%  Weight:    72.1 kg  Height:    5\' 2"  (1.575 m)     General:   Pleasant, cooperative in NAD Head:  Normocephalic and atraumatic. Eyes:   No icterus.   Conjunctiva pink. PERRLA. Ears:  Normal auditory acuity. Neck:  Supple; no masses or thyroidomegaly Lungs: Respirations even and unlabored. Lungs clear to auscultation bilaterally.   No wheezes, crackles, or rhonchi.  Abdomen:  Soft, nondistended, nontender. Normal  bowel sounds. No appreciable masses or hepatomegaly.  No rebound or guarding.  Neurologic:  Alert and oriented x3;  grossly normal neurologically. Skin:  Intact without significant lesions or rashes. Cervical Nodes:  No significant cervical adenopathy. Psych:  Alert and cooperative. Normal affect.  LAB RESULTS: Recent Labs    03/19/18 2248  WBC 17.8*  HGB 14.2  HCT 40.1  PLT 330   BMET Recent Labs    03/19/18 2248  NA 139  K 3.1*  CL 108  CO2 23  GLUCOSE 111*  BUN 14  CREATININE 0.69  CALCIUM 8.7*   LFT Recent Labs    03/19/18 2248  PROT 7.2  ALBUMIN 4.0  AST 23  ALT 19  ALKPHOS 67  BILITOT 0.4   PT/INR No  results for input(s): LABPROT, INR in the last 72 hours.  STUDIES: Ct Abdomen Pelvis W Contrast  Result Date: 03/20/2018 CLINICAL DATA:  Abd pain, diverticulitis suspected. Patient reports abdominal pain for 5 days. EXAM: CT ABDOMEN AND PELVIS WITH CONTRAST TECHNIQUE: Multidetector CT imaging of the abdomen and pelvis was performed using the standard protocol following bolus administration of intravenous contrast. CONTRAST:  100mL ISOVUE-300 IOPAMIDOL (ISOVUE-300) INJECTION 61% COMPARISON:  CT 12/23/2016 FINDINGS: Lower chest: Calcified granuloma in the right middle lobe. No acute airspace disease or pleural fluid. Air-fluid level in the distal esophagus. Hepatobiliary: No focal liver abnormality is seen. No gallstones, gallbladder wall thickening, or biliary dilatation. Pancreas: No ductal dilatation or inflammation. Spleen: Normal in size without focal abnormality. Adrenals/Urinary Tract: Normal adrenal glands. No hydronephrosis or perinephric edema. Homogeneous renal enhancement. Urinary bladder is nondistended but grossly unremarkable. Stomach/Bowel: Stomach is distended with ingested contrast. No gastric wall thickening. No bowel distension, inflammatory change or obstruction. Mild sigmoid colonic tortuosity without colonic wall thickening, inflammatory change or evident mass. Normal appendix, for example image 39 series 5. Vascular/Lymphatic: No significant vascular findings are present. No enlarged abdominal or pelvic lymph nodes. Reproductive: Uterus and bilateral adnexa are unremarkable. Tampon in the vagina. Other: Small to moderate fat containing umbilical hernia has slightly increased in size from prior CT. No inflammatory change or stranding. No abdominal free fluid or free air. 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 No acute osseous abnormalities. IMPRESSION: 1. Air-fluid level in distal esophagus  can be seen with reflux or esophagitis/gastritis. No significant esophageal or gastric wall thickening. 2. No other acute findings in the abdomen or pelvis. 3. Fat containing umbilical hernia is slightly increased in size since CT 15 months ago, no inflammatory change or bowel involvement. Electronically Signed   By: Narda RutherfordMelanie  Sanford M.D.   On: 03/20/2018 00:24      Impression / Plan:   Chrissie NoaDiana Zepahua Rodriguez is a 32 y.o. y/o female with midepigastric abdominal pain, nausea and vomiting, with CT showing air-fluid level in the distal esophagus  CT findings and symptoms may be due to underlying esophagitis versus peptic ulcer disease We discussed EGD versus conservative management, and risks and benefits of both were discussed Patient is agreeable to proceeding with EGD Patient is on clear liquid diet today, n.p.o. past midnight for procedure tomorrow No episodes of bleeding Labs otherwise reassuring White blood cell count was elevated yesterday 17.8, please repeat with differential, this may have been due to dehydration No fevers or signs of infection Mildly hypokalemic yesterday, recheck and replace electrolytes as appropriate  Pt on once daily PPI which is appropriate  We also discussed  that given her family history of colon cancer patient should have earlier colorectal cancer screening and this can be discussed further in clinic given that her father was very young when he was diagnosed with it.  I have discussed alternative options, risks & benefits,  which include, but are not limited to, bleeding, infection, perforation,respiratory complication & drug reaction.  The patient agrees with this plan & written consent will be obtained.     Thank you for involving me in the care of this patient.      LOS: 0 days   Pasty Spillers, MD  03/20/2018, 2:50 PM

## 2018-03-20 NOTE — ED Notes (Signed)
Admitting dr at bedside.  

## 2018-03-20 NOTE — ED Notes (Signed)
Pt back from ct, states that she cont to have pain and nausea

## 2018-03-21 ENCOUNTER — Encounter
Admission: EM | Disposition: A | Discharge status: Home / Self Care | Payer: Self-pay | Source: Home / Self Care | Attending: Emergency Medicine

## 2018-03-21 ENCOUNTER — Encounter: Payer: Self-pay | Admitting: *Deleted

## 2018-03-21 ENCOUNTER — Observation Stay: Payer: 59 | Admitting: Anesthesiology

## 2018-03-21 DIAGNOSIS — R1013 Epigastric pain: Secondary | ICD-10-CM

## 2018-03-21 DIAGNOSIS — K3189 Other diseases of stomach and duodenum: Secondary | ICD-10-CM

## 2018-03-21 DIAGNOSIS — R112 Nausea with vomiting, unspecified: Secondary | ICD-10-CM | POA: Diagnosis not present

## 2018-03-21 HISTORY — PX: ESOPHAGOGASTRODUODENOSCOPY: SHX5428

## 2018-03-21 LAB — BASIC METABOLIC PANEL
ANION GAP: 3 — AB (ref 5–15)
BUN: 10 mg/dL (ref 6–20)
CALCIUM: 8.5 mg/dL — AB (ref 8.9–10.3)
CO2: 25 mmol/L (ref 22–32)
Chloride: 112 mmol/L — ABNORMAL HIGH (ref 98–111)
Creatinine, Ser: 0.59 mg/dL (ref 0.44–1.00)
GFR calc Af Amer: >60 mL/min (ref >60)
GLUCOSE: 97 mg/dL (ref 70–99)
Potassium: 3.9 mmol/L (ref 3.5–5.1)
Sodium: 140 mmol/L (ref 135–145)

## 2018-03-21 LAB — H. PYLORI BREATH TEST: H. pylori UBiT: POSITIVE — AB

## 2018-03-21 LAB — HIV ANTIBODY (ROUTINE TESTING W REFLEX): HIV Screen 4th Generation wRfx: NONREACTIVE

## 2018-03-21 LAB — CALCIUM, IONIZED: CALCIUM, IONIZED, SERUM: 4.8 mg/dL (ref 4.5–5.6)

## 2018-03-21 SURGERY — ESOPHAGOGASTRODUODENOSCOPY (EGD) WITH PROPOFOL
Anesthesia: General

## 2018-03-21 SURGERY — EGD (ESOPHAGOGASTRODUODENOSCOPY)
Anesthesia: General | Laterality: Left

## 2018-03-21 MED ORDER — LIDOCAINE HCL (PF) 2 % IJ SOLN
INTRAMUSCULAR | Status: AC
  Filled 2018-03-21: qty 10

## 2018-03-21 MED ORDER — MIDAZOLAM HCL 2 MG/2ML IJ SOLN
INTRAMUSCULAR | Status: DC | PRN
  Administered 2018-03-21: 2 mg via INTRAVENOUS

## 2018-03-21 MED ORDER — GLYCOPYRROLATE 0.2 MG/ML IJ SOLN
INTRAMUSCULAR | Status: DC | PRN
  Administered 2018-03-21: 0.2 mg via INTRAVENOUS

## 2018-03-21 MED ORDER — PROPOFOL 10 MG/ML IV BOLUS
INTRAVENOUS | Status: DC | PRN
  Administered 2018-03-21: 90 mg via INTRAVENOUS

## 2018-03-21 MED ORDER — PROPOFOL 500 MG/50ML IV EMUL
INTRAVENOUS | Status: DC | PRN
  Administered 2018-03-21: 140 ug/kg/min via INTRAVENOUS

## 2018-03-21 MED ORDER — LIDOCAINE VISCOUS HCL 2 % MT SOLN
15.0000 mL | Freq: Once | OROMUCOSAL | Status: DC
  Filled 2018-03-21: qty 15

## 2018-03-21 MED ORDER — MIDAZOLAM HCL 2 MG/2ML IJ SOLN
INTRAMUSCULAR | Status: AC
  Filled 2018-03-21: qty 2

## 2018-03-21 MED ORDER — DICYCLOMINE HCL 10 MG/5ML PO SOLN
10.0000 mg | Freq: Once | ORAL | Status: DC
  Filled 2018-03-21: qty 5

## 2018-03-21 MED ORDER — ALUM & MAG HYDROXIDE-SIMETH 200-200-20 MG/5ML PO SUSP: 30.0000 mL | Freq: Once | ORAL | Status: DC

## 2018-03-21 MED ORDER — HYOSCYAMINE SULFATE 0.125 MG SL SUBL
0.2500 mg | SUBLINGUAL_TABLET | Freq: Once | SUBLINGUAL | Status: AC
  Administered 2018-03-21: 0.25 mg via SUBLINGUAL
  Filled 2018-03-21: qty 2

## 2018-03-21 MED ORDER — LIDOCAINE HCL (CARDIAC) PF 100 MG/5ML IV SOSY
PREFILLED_SYRINGE | INTRAVENOUS | Status: DC | PRN
  Administered 2018-03-21: 50 mg via INTRAVENOUS

## 2018-03-21 MED ORDER — PROPOFOL 500 MG/50ML IV EMUL
INTRAVENOUS | Status: AC
  Filled 2018-03-21: qty 50

## 2018-03-21 MED ORDER — PHENYLEPHRINE HCL 10 MG/ML IJ SOLN
INTRAMUSCULAR | Status: DC | PRN
  Administered 2018-03-21: 100 ug via INTRAVENOUS

## 2018-03-21 MED ORDER — PROCHLORPERAZINE EDISYLATE 10 MG/2ML IJ SOLN
10.0000 mg | INTRAMUSCULAR | Status: DC | PRN
  Administered 2018-03-21: 10 mg via INTRAVENOUS
  Filled 2018-03-21 (×3): qty 2

## 2018-03-21 NOTE — Op Note (Signed)
[facility] Gastroenterology Patient Name: Katherine Berg Procedure Date: 03/21/2018 12:31 PM MRN: [id number] Account #: [id number] Date of Birth: Jun 02, 1985 Admit Type: Inpatient Age: 32 Room: [facility] ENDO ROOM 4 Gender: Female Note Status: Finalized Procedure:            Upper GI endoscopy Indications:          Epigastric abdominal pain, Nausea with vomiting Providers:            Matheson Vandehei B. Maximino Greenlandahiliani MD, MD Referring MD:         Sallye LatNo Local Md, MD (Referring MD) Medicines:            Monitored Anesthesia Care Complications:        No immediate complications. Procedure:            Pre-Anesthesia Assessment:                       - Prior to the procedure, a History and Physical was                        performed, and patient medications, allergies and                        sensitivities were reviewed. The patient's tolerance of                        previous anesthesia was reviewed.                       - The risks and benefits of the procedure and the                        sedation options and risks were discussed with the                        patient. All questions were answered and informed                        consent was obtained.                       - Patient identification and proposed procedure were                        verified prior to the procedure by the physician, the                        nurse, the anesthesiologist, the anesthetist and the                        technician. The procedure was verified in the procedure                        room.                       - ASA Grade Assessment: II - A patient with mild                        systemic disease.  After obtaining informed consent, the endoscope was                        passed under direct vision. Throughout the procedure,                        the patient's blood pressure, pulse, and oxygen                        saturations were monitored  continuously. The Endoscope                        was introduced through the mouth, and advanced to the                        third part of duodenum. The upper GI endoscopy was                        accomplished with ease. The patient tolerated the                        procedure well. Findings:      The examined esophagus was normal.      Patchy mildly erythematous mucosa without bleeding was found in the       gastric antrum. Biopsies were taken with a cold forceps for histology.       Biopsies were obtained in the gastric body, at the incisura and in the       gastric antrum with cold forceps for histology.      A few localized, non-bleeding erosions were found in the gastric antrum.       There were no stigmata of recent bleeding. Biopsies were taken with a       cold forceps for histology.      Extrinsic compression on the stomach was found in the gastric fundus.       The mucosa in this area appeared normal.      The duodenal bulb, second portion of the duodenum, third portion of the       duodenum and examined duodenum were normal. Impression:           - Normal esophagus.                       - Erythematous mucosa in the antrum. Biopsied.                       - Non-bleeding erosive gastropathy. Biopsied.                       - Extrinsic compression in the gastric fundus.                       - Normal duodenal bulb, second portion of the duodenum,                        third portion of the duodenum and examined duodenum.                       - Biopsies were obtained in the gastric body, at the  incisura and in the gastric antrum. Recommendation:       - Pt had a CT scan on this admission that did not                        mention a mass. Discuss with radiologist to re-assess                        CT scan to correlate if etiology of extrinsic                        compression can be determined.                       Pt may need EUS if etiology  cannot be determined.                       - Await pathology results.                       - Advance diet as tolerated.                       - Continue present medications.                       - Patient has a contact number available for                        emergencies. The signs and symptoms of potential                        delayed complications were discussed with the patient.                        Return to normal activities tomorrow. Written discharge                        instructions were provided to the patient.                       - The findings and recommendations were discussed with                        the patient. Procedure Code(s):    --- Professional ---                       [phone], Esophagogastroduodenoscopy, flexible, transoral;                        with biopsy, single or multiple Diagnosis Code(s):    --- Professional ---                       K31.89, Other diseases of stomach and duodenum                       R10.13, Epigastric pain                       R11.2, Nausea with vomiting, unspecified CPT copyright 2018 American Medical Association. All rights reserved. The codes documented in this report are preliminary and upon coder  review may  be revised to meet current compliance requirements.  Melodie Bouillon, MD Michel Bickers B. Maximino Greenland MD, MD 03/21/2018 1:02:37 PM This report has been signed electronically. Number of Addenda: 0 Note Initiated On: 03/21/2018 12:31 PM Estimated Blood Loss: Estimated blood loss: none.      [facility]

## 2018-03-21 NOTE — Progress Notes (Signed)
[employer] [facility] [place] [facility]   PATIENT NAME: Katherine Berg    MR#:  [id number]  DATE OF BIRTH:  1986-02-07  SUBJECTIVE:  CHIEF COMPLAINT:   Chief Complaint  Patient presents with  . Abdominal Pain  feeling better, EGD noted, sx to see  REVIEW OF SYSTEMS:  CONSTITUTIONAL: No fever, fatigue or weakness.  EYES: No blurred or double vision.  EARS, NOSE, AND THROAT: No tinnitus or ear pain.  RESPIRATORY: No cough, shortness of breath, wheezing or hemoptysis.  CARDIOVASCULAR: No chest pain, orthopnea, edema.  GASTROINTESTINAL: No nausea, vomiting, diarrhea or abdominal pain.  GENITOURINARY: No dysuria, hematuria.  ENDOCRINE: No polyuria, nocturia,  HEMATOLOGY: No anemia, easy bruising or bleeding SKIN: No rash or lesion. MUSCULOSKELETAL: No joint pain or arthritis.   NEUROLOGIC: No tingling, numbness, weakness.  PSYCHIATRY: No anxiety or depression.   ROS  DRUG ALLERGIES:  No Known Allergies  VITALS:  Blood pressure 94/60, pulse 61, temperature 98.4 F (36.9 C), temperature source Tympanic, resp. rate 16, height 5\' 2"  (1.575 m), weight 72.1 kg, last menstrual period 03/17/2018, SpO2 100 %.  PHYSICAL EXAMINATION:  GENERAL:  32 y.o.-year-old patient lying in the bed with no acute distress.  EYES: Pupils equal, round, reactive to light and accommodation. No scleral icterus. Extraocular muscles intact.  HEENT: Head atraumatic, normocephalic. Oropharynx and nasopharynx clear.  NECK:  Supple, no jugular venous distention. No thyroid enlargement, no tenderness.  LUNGS: Normal breath sounds bilaterally, no wheezing, rales,rhonchi or crepitation. No use of accessory muscles of respiration.  CARDIOVASCULAR: S1, S2 normal. No murmurs, rubs, or gallops.  ABDOMEN: Soft, nontender, nondistended. Bowel sounds present. No organomegaly or mass.  EXTREMITIES: No pedal edema, cyanosis, or clubbing.  NEUROLOGIC: Cranial nerves II through XII are intact. Muscle  strength 5/5 in all extremities. Sensation intact. Gait not checked.  PSYCHIATRIC: The patient is alert and oriented x 3.  SKIN: No obvious rash, lesion, or ulcer.   Physical Exam LABORATORY PANEL:   CBC Recent Labs  Lab 03/19/18 2248  WBC 17.8*  HGB 14.2  HCT 40.1  PLT 330   ------------------------------------------------------------------------------------------------------------------  Chemistries  Recent Labs  Lab 03/19/18 2248 03/21/18 0406  NA 139 140  K 3.1* 3.9  CL 108 112*  CO2 23 25  GLUCOSE 111* 97  BUN 14 10  CREATININE 0.69 0.59  CALCIUM 8.7* 8.5*  MG 2.0  --   AST 23  --   ALT 19  --   ALKPHOS 67  --   BILITOT 0.4  --    ------------------------------------------------------------------------------------------------------------------  Cardiac Enzymes No results for input(s): TROPONINI in the last 168 hours. ------------------------------------------------------------------------------------------------------------------  RADIOLOGY:  Ct Abdomen Pelvis W Contrast  Result Date: 03/20/2018 CLINICAL DATA:  Abd pain, diverticulitis suspected. Patient reports abdominal pain for 5 days. EXAM: CT ABDOMEN AND PELVIS WITH CONTRAST TECHNIQUE: Multidetector CT imaging of the abdomen and pelvis was performed using the standard protocol following bolus administration of intravenous contrast. CONTRAST:  ISOVUE-300 IOPAMIDOL (ISOVUE-300) INJECTION 61% COMPARISON:  CT 12/23/2016 FINDINGS: Lower chest: Calcified granuloma in the right middle lobe. No acute airspace disease or pleural fluid. Air-fluid level in the distal esophagus. Hepatobiliary: No focal liver abnormality is seen. No gallstones, gallbladder wall thickening, or biliary dilatation. Pancreas: No ductal dilatation or inflammation. Spleen: Normal in size without focal abnormality. Adrenals/Urinary Tract: Normal adrenal glands. No hydronephrosis or perinephric edema. Homogeneous renal enhancement. Urinary  bladder is nondistended but grossly unremarkable. Stomach/Bowel: Stomach is distended with ingested contrast. No  gastric wall thickening. No bowel distension, inflammatory change or obstruction. Mild sigmoid colonic tortuosity without colonic wall thickening, inflammatory change or evident mass. Normal appendix, for example image 39 series 5. Vascular/Lymphatic: No significant vascular findings are present. No enlarged abdominal or pelvic lymph nodes. Reproductive: Uterus and bilateral adnexa are unremarkable. Tampon in the vagina. Other: Small to moderate fat containing umbilical hernia has slightly increased in size from prior CT. No inflammatory change or stranding. No abdominal free fluid or free air. 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 No acute osseous abnormalities. IMPRESSION: 1. Air-fluid level in distal esophagus can be seen with reflux or esophagitis/gastritis. No significant esophageal or gastric wall thickening. 2. No other acute findings in the abdomen or pelvis. 3. Fat containing umbilical hernia is slightly increased in size since CT 15 months ago, no inflammatory change or bowel involvement. Electronically Signed   By: Narda RutherfordMelanie  Sanford M.D.   On: 03/20/2018 00:24    ASSESSMENT AND PLAN:  *Acute epigastric pain Improved S/P EGD today noted for extrinsic compression/gastritis/bxs done, c/s surgery per GI recomendations, continue PPI, adult pain protocol, antiemetics prn, start clear liquids, increase activity, and continue close monitoring  *Acute abnormal CTA Noted for hernia and esophagitis, possible reflux Plan of care per above  Disposition pending surgery and GI clearance  All the records are reviewed and case discussed with Care Management/Social Workerr. Management plans discussed with the patient, family and they are in agreement.  CODE STATUS: full  TOTAL TIME TAKING CARE OF  THIS PATIENT: 35 minutes.     POSSIBLE D/C IN 1-2 DAYS, DEPENDING ON CLINICAL CONDITION.   Evelena AsaMontell D  M.D on 03/21/2018   Between 7am to 6pm - Pager - [phone]  After 6pm go to [email or web address] - Social research officer, governmentpassword EPAS [facility]  [facility]  Office  [phone]  CC: Primary care physician; [facility], [facility]  Note: This dictation was prepared with Nurse, children'sDragon dictation along with smaller phrase technology. Any transcriptional errors that result from this process are unintentional.

## 2018-03-21 NOTE — Consult Note (Signed)
Patient ID: Katherine Berg, female   DOB: [birth year], 32 y.o.   MRN: [id number]  HPI Katherine Berg is a 32 y.o. female seen at the request of Dr. Katheren Shams and case discussed with him. She presented yesterday to the Er c/o abdominal pain and left sided chest wall pain. Pain is intermittent moderate and sharp and cramping. No specific alleviating or aggravating factors. She does reports some nausea. 1 episode of emesis. No fevers, chills, weight loss. Of note she reports she has some periumbuilical pain related to her ventral hernia that worsens when she moves around. This is not the pain she came here for this time. No previous abd operations. Able to perform more than 4 METS w/o SOB or Cp. She has had a recent EGD and CT . I have personally reviewed the images, here is an umbilical hernia, no incarceration, no free air , no obstruction. EGD no major findings. BMP nml creat. Wbc 17 bh 14. LFT nml  HPI  History reviewed. No pertinent past medical history.  History reviewed. No pertinent surgical history.  History reviewed. No pertinent family history.  Social History Social History   Tobacco Use  . Smoking status: Never Smoker  . Smokeless tobacco: Never Used  Substance Use Topics  . Alcohol use: Yes    Comment: occ  . Drug use: Never    No Known Allergies  Current Facility-Administered Medications  Medication Dose Route Frequency Provider Last Rate Last Dose  . acetaminophen (TYLENOL) tablet 650 mg  650 mg Oral Q6H PRN Barbaraann Rondo, MD   650 mg at 03/20/18 2011   Or  . acetaminophen (TYLENOL) suppository 650 mg  650 mg Rectal Q6H PRN Barbaraann Rondo, MD      . alum & mag hydroxide-simeth (MAALOX/MYLANTA) 200-200-20 MG/5ML suspension 15 mL  15 mL Oral Q4H PRN Barbaraann Rondo, MD      . alum & mag hydroxide-simeth (MAALOX/MYLANTA) 200-200-20 MG/5ML suspension 30 mL  30 mL Oral Once Salary, Montell D, MD       And  . lidocaine (XYLOCAINE) 2 % viscous  mouth solution 15 mL  15 mL Oral Once Salary, Montell D, MD      . bisacodyl (DULCOLAX) EC tablet 5 mg  5 mg Oral Daily PRN Barbaraann Rondo, MD      . dicyclomine (BENTYL) 10 MG/5ML syrup 10 mg  10 mg Oral Once Salary, Montell D, MD      . enoxaparin (LOVENOX) injection 40 mg  40 mg Subcutaneous Q24H Barbaraann Rondo, MD   40 mg at 03/21/18 0824  . morphine 2 MG/ML injection 2 mg  2 mg Intravenous Once Barbaraann Rondo, MD      . ondansetron (ZOFRAN) injection 4 mg  4 mg Intravenous Q6H PRN Barbaraann Rondo, MD   4 mg at 03/21/18 1607  . pantoprazole (PROTONIX) injection 40 mg  40 mg Intravenous Q24H Salary, Montell D, MD   40 mg at 03/21/18 0830  . senna-docusate (Senokot-S) tablet 1 tablet  1 tablet Oral QHS PRN Barbaraann Rondo, MD      . sucralfate (CARAFATE) 1 GM/10ML suspension 1 g  1 g Oral TID WC & HS Barbaraann Rondo, MD   1 g at 03/20/18 2209  . traMADol (ULTRAM) tablet 100 mg  100 mg Oral Q6H PRN Salary, Montell D, MD      . traMADol (ULTRAM) tablet 50 mg  50 mg Oral Q6H PRN Salary, Montell D, MD   50 mg at 03/20/18 1757  Review of Systems Full ROS  was asked and was negative except for the information on the HPI  Physical Exam Blood pressure 94/60, pulse 61, temperature 98.4 F (36.9 C), temperature source Tympanic, resp. rate 16, height 5\' 2"  (1.575 m), weight 72.1 kg, last menstrual period 03/17/2018, SpO2 100 %. CONSTITUTIONAL: NAD EYES: Pupils are equal, round, and reactive to light, Sclera are non-icteric. EARS, NOSE, MOUTH AND THROAT: The oropharynx is clear. The oral mucosa is pink and moist. Hearing is intact to voice. LYMPH NODES:  Lymph nodes in the neck are normal. RESPIRATORY:  Lungs are clear. There is normal respiratory effort, with equal breath sounds bilaterally, and without pathologic use of accessory muscles. CARDIOVASCULAR: Heart is regular without murmurs, gallops, or rubs. CHEst: tender on left costal margin and sternum.  GI: The  abdomen is soft, nontender, and nondistended. There are no palpable masses. There is no hepatosplenomegaly. There are normal bowel sounds in all quadrants. Reducible UH. GU: Rectal deferred.   MUSCULOSKELETAL: Normal muscle strength and tone. No cyanosis or edema.   SKIN: Turgor is good and there are no pathologic skin lesions or ulcers. NEUROLOGIC: Motor and sensation is grossly normal. Cranial nerves are grossly intact. PSYCH:  Oriented to person, place and time. Affect is normal.  Data Reviewed  I have personally reviewed the patient's imaging, laboratory findings and medical records.    Assessment/Plan Abdominal and chest wall pain. Clinically c/w costochondritis and functional gi issues. No evidence of incarceration of the hernia./ She reports some symptoms related to hernia and we can certainly repair this in the outpt basis. No need for any surgical intervention at this time. Another differential dx would be GB disease. I would order U/S RUQ to r/o GS. Low yield given no RUQ pain. Pain is mainly on her left chest wall. We will be available for any surgical needs. Extensive counseling provided.   Sterling Bigiego Earlyn Sylvan, MD FACS General Surgeon 03/21/2018, 5:17 PM

## 2018-03-21 NOTE — Anesthesia Preprocedure Evaluation (Signed)
Anesthesia Evaluation  Patient identified by MRN, date of birth, ID band Patient awake    Reviewed: Allergy & Precautions, H&P , NPO status , Patient's Chart, lab work & pertinent test results, reviewed documented beta blocker date and time   History of Anesthesia Complications Negative for: history of anesthetic complications  Airway Mallampati: I  TM Distance: >3 FB Neck ROM: full    Dental  (+) Dental Advidsory Given, Teeth Intact   Pulmonary neg shortness of breath, neg sleep apnea, neg COPD, Recent URI ,           Cardiovascular Exercise Tolerance: Good negative cardio ROS       Neuro/Psych negative neurological ROS  negative psych ROS   GI/Hepatic negative GI ROS, Neg liver ROS,   Endo/Other  negative endocrine ROS  Renal/GU negative Renal ROS  negative genitourinary   Musculoskeletal   Abdominal   Peds  Hematology negative hematology ROS (+)   Anesthesia Other Findings History reviewed. No pertinent past medical history.   Reproductive/Obstetrics negative OB ROS                             Anesthesia Physical Anesthesia Plan  ASA: I  Anesthesia Plan: General   Post-op Pain Management:    Induction: Intravenous  PONV Risk Score and Plan: 3 and Propofol infusion and TIVA  Airway Management Planned: Natural Airway and Nasal Cannula  Additional Equipment:   Intra-op Plan:   Post-operative Plan:   Informed Consent: I have reviewed the patients History and Physical, chart, labs and discussed the procedure including the risks, benefits and alternatives for the proposed anesthesia with the patient or authorized representative who has indicated his/her understanding and acceptance.   Dental Advisory Given  Plan Discussed with: Anesthesiologist, CRNA and Surgeon  Anesthesia Plan Comments:         Anesthesia Quick Evaluation

## 2018-03-21 NOTE — Anesthesia Post-op Follow-up Note (Signed)
Anesthesia QCDR form completed.        

## 2018-03-21 NOTE — Progress Notes (Signed)
Katherine Bouillon, MD [address], [address], [place], [place], [address] [address], [place], [place], [address] Phone: [phone]  Fax: [phone]   Subjective: Patient reports continued nausea, however reports overall improvement since admission.  No hematemesis.   Objective: Exam: Vital signs in last 24 hours: Vitals:   03/20/18 0315 03/20/18 0334 03/20/18 2115 03/21/18 0459  BP: (!) 108/58 93/65 108/65 96/62  Pulse: (!) 57 73 62 (!) 56  Resp: 19  20 20   Temp:  98.1 F (36.7 C) 98.7 F (37.1 C) 97.9 F (36.6 C)  TempSrc:  Oral Oral Oral  SpO2: 95% 98% 100% 98%  Weight:  72.1 kg    Height:  5\' 2"  (1.575 m)     Weight change:   Intake/Output Summary (Last 24 hours) at 03/21/2018 1204 Last data filed at 03/21/2018 0305 Gross per 24 hour  Intake 333.93 ml  Output -  Net 333.93 ml    General: No acute distress, AAO x3 Abd: Soft, NT/ND, No HSM Skin: Warm, no rashes Neck: Supple, Trachea midline   Lab Results: Lab Results  Component Value Date   WBC 17.8 (H) 03/19/2018   HGB 14.2 03/19/2018   HCT 40.1 03/19/2018   MCV 92.4 03/19/2018   PLT 330 03/19/2018   Micro Results: No results found for this or any previous visit (from the past 240 hour(s)). Studies/Results: Ct Abdomen Pelvis W Contrast  Result Date: 03/20/2018 CLINICAL DATA:  Abd pain, diverticulitis suspected. Patient reports abdominal pain for 5 days. EXAM: CT ABDOMEN AND PELVIS WITH CONTRAST TECHNIQUE: Multidetector CT imaging of the abdomen and pelvis was performed using the standard protocol following bolus administration of intravenous contrast. CONTRAST:  ISOVUE-300 IOPAMIDOL (ISOVUE-300) INJECTION 61% COMPARISON:  CT 12/23/2016 FINDINGS: Lower chest: Calcified granuloma in the right middle lobe. No acute airspace disease or pleural fluid. Air-fluid level in the distal esophagus. Hepatobiliary: No focal liver abnormality is seen. No gallstones, gallbladder wall  thickening, or biliary dilatation. Pancreas: No ductal dilatation or inflammation. Spleen: Normal in size without focal abnormality. Adrenals/Urinary Tract: Normal adrenal glands. No hydronephrosis or perinephric edema. Homogeneous renal enhancement. Urinary bladder is nondistended but grossly unremarkable. Stomach/Bowel: Stomach is distended with ingested contrast. No gastric wall thickening. No bowel distension, inflammatory change or obstruction. Mild sigmoid colonic tortuosity without colonic wall thickening, inflammatory change or evident mass. Normal appendix, for example image 39 series 5. Vascular/Lymphatic: No significant vascular findings are present. No enlarged abdominal or pelvic lymph nodes. Reproductive: Uterus and bilateral adnexa are unremarkable. Tampon in the vagina. Other: Small to moderate fat containing umbilical hernia has slightly increased in size from prior CT. No inflammatory change or stranding. No abdominal free fluid or free air. 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 No acute osseous abnormalities. IMPRESSION: 1. Air-fluid level in distal esophagus can be seen with reflux or esophagitis/gastritis. No significant esophageal or gastric wall thickening. 2. No other acute findings in the abdomen or pelvis. 3. Fat containing umbilical hernia is slightly increased in size since CT 15 months ago, no inflammatory change or bowel involvement. Electronically Signed   By: Narda Rutherford M.D.   On: 03/20/2018 00:24   Medications:  Scheduled Meds: . [MAR Hold] enoxaparin (LOVENOX) injection  40 mg Subcutaneous Q24H  . [MAR Hold]  morphine injection  2 mg Intravenous Once  . [MAR Hold] pantoprazole (PROTONIX) IV  40 mg Intravenous Q24H  . [facility] Hold]  sucralfate  1 g Oral TID WC & HS   Continuous Infusions: . sodium chloride 20 mL/hr at 03/21/18 0305   PRN Meds:.[MAR Hold] acetaminophen  **OR** [MAR Hold] acetaminophen, [MAR Hold] alum & mag hydroxide-simeth, [MAR Hold] bisacodyl, [MAR Hold] ondansetron (ZOFRAN) IV, [MAR Hold] senna-docusate, [MAR Hold] traMADol, [MAR Hold] traMADol   Assessment: Active Problems:   Intractable nausea and vomiting    Plan: We will plan on EGD today for evaluation We will rule out gastric outlet obstruction, esophagitis, gastritis, peptic ulcer disease, or upper GI malignancy Continue n.p.o.  I have discussed alternative options, risks & benefits,  which include, but are not limited to, bleeding, infection, perforation,respiratory complication & drug reaction.  The patient agrees with this plan & written consent will be obtained.      LOS: 0 days   Katherine BouillonVarnita Rodolfo Gaster, MD 03/21/2018, 12:04 PM

## 2018-03-21 NOTE — Transfer of Care (Signed)
Immediate Anesthesia Transfer of Care Note  Patient: Katherine Berg  Procedure(s) Performed: ESOPHAGOGASTRODUODENOSCOPY (EGD) (Left )  Patient Location: PACU and Endoscopy Unit  Anesthesia Type:General  Level of Consciousness: drowsy  Airway & Oxygen Therapy: Patient Spontanous Breathing  Post-op Assessment: Report given to RN and Post -op Vital signs reviewed and stable  Post vital signs: Reviewed and stable  Last Vitals:  Vitals Value Taken Time  BP    Temp    Pulse    Resp    SpO2      Last Pain:  Vitals:   03/21/18 0830  TempSrc:   PainSc: 0-No pain         Complications: No apparent anesthesia complications

## 2018-03-21 NOTE — Progress Notes (Signed)
I talked to the radiologist personally, and they reviewed the CT scan again, and state that there are no abnormalities causing extrinsic compression at the gastric fundus.  They stated, that the left lobe of the liver is in that area, but they do not see any structures causing extrinsic compression in that area.  At this point, would recommend primary team to consider surgery consult due to the increase in size of umbilical hernia mentioned in the CT scan.  This by itself is not likely to explain her symptoms, however.  If her symptoms continue as an outpatient, can consider EUS to evaluate the compression seen on EGD.  Await biopsy results to rule out H. pylori.

## 2018-03-22 ENCOUNTER — Observation Stay: Payer: 59

## 2018-03-22 ENCOUNTER — Encounter: Payer: Self-pay | Admitting: Gastroenterology

## 2018-03-22 DIAGNOSIS — B9681 Helicobacter pylori [H. pylori] as the cause of diseases classified elsewhere: Secondary | ICD-10-CM | POA: Diagnosis not present

## 2018-03-22 DIAGNOSIS — K2970 Gastritis, unspecified, without bleeding: Secondary | ICD-10-CM | POA: Diagnosis not present

## 2018-03-22 DIAGNOSIS — R112 Nausea with vomiting, unspecified: Secondary | ICD-10-CM | POA: Diagnosis not present

## 2018-03-22 LAB — URINE DRUGS OF ABUSE SCREEN W ALC, ROUTINE (REF LAB)
Amphetamines, Urine: NEGATIVE ng/mL
BARBITURATE, UR: NEGATIVE ng/mL
Benzodiazepine Quant, Ur: NEGATIVE ng/mL
Cannabinoid Quant, Ur: NEGATIVE ng/mL
Cocaine (Metab.): NEGATIVE ng/mL
Ethanol U, Quan: NEGATIVE %
Methadone Screen, Urine: NEGATIVE ng/mL
PROPOXYPHENE, URINE: NEGATIVE ng/mL
Phencyclidine, Ur: NEGATIVE ng/mL

## 2018-03-22 LAB — OPIATES CONFIRMATION, URINE
CODEINE: NEGATIVE
MORPHINE: POSITIVE — AB
Morphine GC/MS Conf: 506 ng/mL
OPIATES: POSITIVE — AB

## 2018-03-22 MED ORDER — CLARITHROMYCIN 500 MG PO TABS: 500.0000 mg | ORAL_TABLET | Freq: Two times a day (BID) | ORAL | 0 refills | Status: AC

## 2018-03-22 MED ORDER — OMEPRAZOLE MAGNESIUM 20 MG PO TBEC: 20.0000 mg | DELAYED_RELEASE_TABLET | Freq: Two times a day (BID) | ORAL | 0 refills | Status: DC

## 2018-03-22 MED ORDER — AMOXICILLIN 500 MG PO TABS: 1000.0000 mg | ORAL_TABLET | Freq: Two times a day (BID) | ORAL | 0 refills | Status: AC

## 2018-03-22 MED ORDER — POTASSIUM CHLORIDE CRYS ER 20 MEQ PO TBCR: 20.0000 meq | EXTENDED_RELEASE_TABLET | Freq: Every day | ORAL | 0 refills | Status: DC

## 2018-03-22 MED ORDER — ONDANSETRON HCL 4 MG PO TABS: ORAL_TABLET | ORAL | 0 refills | Status: DC

## 2018-03-22 NOTE — Discharge Summary (Signed)
[facility] [place] [facility]   PATIENT NAME: Katherine Berg    MR#:  [id number]  DATE OF BIRTH:  10/27/1985  DATE OF ADMISSION:  03/19/2018 ADMITTING PHYSICIAN: Barbaraann Rondo, MD  DATE OF DISCHARGE: 03/22/2018  PRIMARY CARE PHYSICIAN: [facility]    ADMISSION DIAGNOSIS:  LUQ abdominal pain [R10.12] Intractable pain [R52] Acute gastritis, presence of bleeding unspecified, unspecified gastritis type [K29.00] Intractable vomiting with nausea, unspecified vomiting type [R11.2]  DISCHARGE DIAGNOSIS:  Active Problems:   Intractable nausea and vomiting   Gastric irritation   Abdominal pain, epigastric   SECONDARY DIAGNOSIS:  History reviewed. No pertinent past medical history.  HOSPITAL COURSE:   32 year old female with no significant past medical history of present to the hospital due to nausea vomiting and left upper quadrant abdominal pain.  1.  Nausea vomiting/abdominal pain-patient underwent a CT scan of the abdomen pelvis on admission which was suggestive of esophagitis.  Patient was seen by gastroenterology underwent an upper GI endoscopy which showed no peptic ulcer disease but some nonbleeding erosive gastropathy and erythematous mucosa in the antrum. - Patient was treated supportively with IV PPI, Carafate.  Patient's H. pylori breath test was positive. - Patient is empirically being discharged on 2 weeks of amoxicillin clarithromycin, PPI.  She will follow-up with gastroenterology as an outpatient.   -Her symptoms post endoscopy have improved, she is tolerating a soft diet well.  2.  Hiatal/ventral hernia- this was noted on the CT scan of the abdomen pelvis on admission.  A surgical consult was obtained.  They did not recommend any acute surgical intervention but rather outpatient follow-up.  DISCHARGE CONDITIONS:   Stable  CONSULTS OBTAINED:  Treatment Team:  Barbaraann Rondo, MD Pasty Spillers, MD Leafy Ro, MD  DRUG ALLERGIES:  No Known Allergies  DISCHARGE MEDICATIONS:   Allergies as of 03/22/2018   No Known Allergies     Medication List    STOP taking these medications   ondansetron 4 MG disintegrating tablet Commonly known as:  ZOFRAN-ODT     TAKE these medications   amoxicillin 500 MG tablet Commonly known as:  AMOXIL Take 2 tablets (1,000 mg total) by mouth 2 (two) times daily for 14 days.   clarithromycin 500 MG tablet Commonly known as:  BIAXIN Take 1 tablet (500 mg total) by mouth 2 (two) times daily for 14 days.   docusate sodium 100 MG capsule Commonly known as:  COLACE Take 1 tablet once or twice daily as needed for constipation while taking narcotic pain medicine   omeprazole 20 MG tablet Commonly known as:  PRILOSEC OTC Take 1 tablet (20 mg total) by mouth 2 (two) times daily for 14 days.   ondansetron 4 MG tablet Commonly known as:  ZOFRAN Take 1-2 tabs by mouth every 8 hours as needed for nausea/vomiting   potassium chloride SA 20 MEQ tablet Commonly known as:  K-DUR,KLOR-CON Take 1 tablet (20 mEq total) by mouth daily.   sucralfate 1 g tablet Commonly known as:  CARAFATE Take 1 tablet (1 g total) by mouth 4 (four) times daily as needed (for abdominal discomfort, nausea, and/or vomiting).         DISCHARGE INSTRUCTIONS:   DIET:  Regular diet  DISCHARGE CONDITION:  Stable  ACTIVITY:  Activity as tolerated  OXYGEN:  Home Oxygen: No.   Oxygen Delivery: room air  DISCHARGE LOCATION:  home   If you experience worsening of your admission symptoms, develop  shortness of breath, life threatening emergency, suicidal or homicidal thoughts you must seek medical attention immediately by calling 911 or calling your MD immediately  if symptoms less severe.  You Must read complete instructions/literature along with all the possible adverse reactions/side effects for all the Medicines you take and that have been prescribed to  you. Take any new Medicines after you have completely understood and accpet all the possible adverse reactions/side effects.   Please note  You were cared for by a hospitalist during your hospital stay. If you have any questions about your discharge medications or the care you received while you were in the hospital after you are discharged, you can call the unit and asked to speak with the hospitalist on call if the hospitalist that took care of you is not available. Once you are discharged, your primary care physician will handle any further medical issues. Please note that NO REFILLS for any discharge medications will be authorized once you are discharged, as it is imperative that you return to your primary care physician (or establish a relationship with a primary care physician if you do not have one) for your aftercare needs so that they can reassess your need for medications and monitor your lab values.     Today   No acute events overnight, tolerating a soft diet well.  Denies any further nausea vomiting.  Patient's H. pylori breath test positive.  Will discharge patient home on oral antibiotics and PPI as mentioned.  VITAL SIGNS:  Blood pressure 109/76, pulse 68, temperature 98.1 F (36.7 C), temperature source Oral, resp. rate 16, height 5\' 2"  (1.575 m), weight 72.1 kg, last menstrual period 03/17/2018, SpO2 99 %.  I/O:    Intake/Output Summary (Last 24 hours) at 03/22/2018 1401 Last data filed at 03/22/2018 1348 Gross per 24 hour  Intake 600 ml  Output 1 ml  Net 599 ml    PHYSICAL EXAMINATION:  GENERAL:  32 y.o.-year-old patient lying in the bed with no acute distress.  EYES: Pupils equal, round, reactive to light and accommodation. No scleral icterus. Extraocular muscles intact.  HEENT: Head atraumatic, normocephalic. Oropharynx and nasopharynx clear.  NECK:  Supple, no jugular venous distention. No thyroid enlargement, no tenderness.  LUNGS: Normal breath sounds  bilaterally, no wheezing, rales,rhonchi. No use of accessory muscles of respiration.  CARDIOVASCULAR: S1, S2 normal. No murmurs, rubs, or gallops.  ABDOMEN: Soft, non-tender, non-distended. Bowel sounds present. No organomegaly or mass.  EXTREMITIES: No pedal edema, cyanosis, or clubbing.  NEUROLOGIC: Cranial nerves II through XII are intact. No focal motor or sensory defecits b/l.  PSYCHIATRIC: The patient is alert and oriented x 3. SKIN: No obvious rash, lesion, or ulcer.   DATA REVIEW:   CBC Recent Labs  Lab 03/19/18 2248  WBC 17.8*  HGB 14.2  HCT 40.1  PLT 330    Chemistries  Recent Labs  Lab 03/19/18 2248 03/21/18 0406  NA 139 140  K 3.1* 3.9  CL 108 112*  CO2 23 25  GLUCOSE 111* 97  BUN 14 10  CREATININE 0.69 0.59  CALCIUM 8.7* 8.5*  MG 2.0  --   AST 23  --   ALT 19  --   ALKPHOS 67  --   BILITOT 0.4  --     Cardiac Enzymes No results for input(s): TROPONINI in the last 168 hours.  Microbiology Results  No results found for this or any previous visit.  RADIOLOGY:  US Abdomen Limited Ruq  Result Date:  03/22/2018 CLINICAL DATA:  Epigastric pain EXAM: ULTRASOUND ABDOMEN LIMITED RIGHT UPPER QUADRANT COMPARISON:  CT scan of the abdomen and pelvis of March 20, 2018 FINDINGS: Gallbladder: No gallstones or wall thickening visualized. No sonographic Murphy sign noted by sonographer. Common bile duct: Diameter: 2.7 mm Liver: The hepatic echotexture is increased diffusely. The surface contour is smooth. There is no focal mass nor ductal dilation. Portal vein is patent on color Doppler imaging with normal direction of blood flow towards the liver. IMPRESSION: Increased hepatic echotexture most compatible with fatty infiltrative change. Normal common bile duct and gallbladder. Electronically Signed   By: David  SwazilandJordan M.D.   On: 03/22/2018 09:10      Management plans discussed with the patient, family and they are in agreement.  CODE STATUS:     Code Status  Orders  (From admission, onward)         Start     Ordered   03/20/18 0350  Full code  Continuous     03/20/18 0349        Code Status History    This patient has a current code status but no historical code status.      TOTAL TIME TAKING CARE OF THIS PATIENT: 40 minutes.    Houston SirenSAINANI,VIVEK J M.D on 03/22/2018 at 2:01 PM  Between 7am to 6pm - Pager - [phone]  After 6pm go to [email or web address] - Therapist, nutritionalpassword EPAS [facility]  [facility] [place] [facility]  Office  [phone]  CC: Primary care physician; [facility]

## 2018-03-22 NOTE — Anesthesia Postprocedure Evaluation (Signed)
Anesthesia Post Note  Patient: Katherine Berg  Procedure(s) Performed: ESOPHAGOGASTRODUODENOSCOPY (EGD) (Left )  Patient location during evaluation: PACU Anesthesia Type: General Level of consciousness: awake and alert Pain management: pain level controlled Vital Signs Assessment: post-procedure vital signs reviewed and stable Respiratory status: spontaneous breathing, nonlabored ventilation, respiratory function stable and patient connected to nasal cannula oxygen Cardiovascular status: blood pressure returned to baseline and stable Postop Assessment: no apparent nausea or vomiting Anesthetic complications: no     Last Vitals:  Vitals:   03/21/18 1325 03/21/18 2039  BP: 94/60 109/76  Pulse: 61 68  Resp: 16 16  Temp:  36.7 C  SpO2: 100% 99%    Last Pain:  Vitals:   03/21/18 2100  TempSrc:   PainSc: 0-No pain                 Jovita GammaKathryn L Fitzgerald

## 2018-03-22 NOTE — Progress Notes (Signed)
[facility]    Katherine PitterDiana Berg Katherine HandingRodriguez was admitted to the Hospital on 03/19/2018 and Discharged  03/22/2018 and should be excused from work/school   for 5 days starting 03/19/2018 , may return to work/school without any restrictions.  Call Hilda LiasVivek Sainani MD, [facility]  [phone] with questions.  Houston SirenSAINANI,VIVEK J M.D on 03/22/2018,at 2:06 PM

## 2018-03-22 NOTE — Progress Notes (Signed)
   Melodie BouillonVarnita Tarik Teixeira, MD [address], [address], [place], [place], [id number] [address], [address], [place], [place], [address] Phone: [phone]  Fax: [phone]   Subjective: Abdominal pain and nausea vomiting resolved.  Patient tolerating full solid food tray today without any difficulty.  H. pylori breath test positive.   Objective: Exam: Vital signs in last 24 hours: Vitals:   03/21/18 1305 03/21/18 1315 03/21/18 1325 03/21/18 2039  BP: (!) 87/58 (!) 96/53 94/60 109/76  Pulse: 67 61 61 68  Resp: 18 15 16 16   Temp:    98.1 F (36.7 C)  TempSrc:    Oral  SpO2: 100% 100% 100% 99%  Weight:      Height:       Weight change:   Intake/Output Summary (Last 24 hours) at 03/22/2018 1121 Last data filed at 03/22/2018 0941 Gross per 24 hour  Intake 910 ml  Output 1 ml  Net 909 ml    General: No acute distress, AAO x3 Abd: Soft, NT/ND, No HSM Skin: Warm, no rashes Neck: Supple, Trachea midline   Lab Results: Lab Results  Component Value Date   WBC 17.8 (H) 03/19/2018   HGB 14.2 03/19/2018   HCT 40.1 03/19/2018   MCV 92.4 03/19/2018   PLT 330 03/19/2018   Micro Results: No results found for this or any previous visit (from the past 240 hour(s)). Studies/Results: Kore[place] Abdomen Limited Ruq  Result Date: 03/22/2018 CLINICAL DATA:  Epigastric pain EXAM: ULTRASOUND ABDOMEN LIMITED RIGHT UPPER QUADRANT COMPARISON:  CT scan of the abdomen and pelvis of March 20, 2018 FINDINGS: Gallbladder: No gallstones or wall thickening visualized. No sonographic Murphy sign noted by sonographer. Common bile duct: Diameter: 2.7 mm Liver: The hepatic echotexture is increased diffusely. The surface contour is smooth. There is no focal mass nor ductal dilation. Portal vein is patent on color Doppler imaging with normal direction of blood flow towards the liver. IMPRESSION: Increased hepatic echotexture most compatible with fatty infiltrative change. Normal common bile duct and  gallbladder. Electronically Signed   By: David  Swazil[place]Jordan M.D.   On: 03/22/2018 09:10   Medications:  Scheduled Meds: . alum & mag hydroxide-simeth  30 mL Oral Once   And  . lidocaine  15 mL Oral Once  . dicyclomine  10 mg Oral Once  . enoxaparin (LOVENOX) injection  40 mg Subcutaneous Q24H  .  morphine injection  2 mg Intravenous Once  . pantoprazole (PROTONIX) IV  40 mg Intravenous Q24H  . sucralfate  1 g Oral TID WC & HS   Continuous Infusions: PRN Meds:.acetaminophen **OR** acetaminophen, alum & mag hydroxide-simeth, bisacodyl, ondansetron (ZOFRAN) IV, prochlorperazine, senna-docusate, traMADol, traMADol   Assessment: Active Problems:   Intractable nausea and vomiting   Gastric irritation   Abdominal pain, epigastric    Plan: Symptoms have resolved Treat H. pylori with triple therapy, as that can explain some of her symptoms  She can be follow-up in clinic and eradication testing can be considered at that time Follow-up pending biopsy results  Patient should follow-up in clinic also in regard to her family history of colon cancer and discussed high risk screening.  Patient verbalized understanding of this.  please make GI clinic appointment prior to discharge.    LOS: 0 days   Melodie BouillonVarnita Dimas Scheck, MD 03/22/2018, 11:21 AM

## 2018-03-27 LAB — SURGICAL PATHOLOGY

## 2018-03-30 ENCOUNTER — Ambulatory Visit: Payer: 59 | Admitting: Gastroenterology

## 2018-04-03 ENCOUNTER — Ambulatory Visit (INDEPENDENT_AMBULATORY_CARE_PROVIDER_SITE_OTHER): Payer: 59 | Admitting: Surgery

## 2018-04-03 ENCOUNTER — Encounter: Payer: Self-pay | Admitting: Surgery

## 2018-04-03 ENCOUNTER — Other Ambulatory Visit: Payer: Self-pay

## 2018-04-03 VITALS — BP 124/78 | HR 80 | Temp 97.3°F | Resp 18 | Ht 62.0 in | Wt 160.4 lb

## 2018-04-03 DIAGNOSIS — K429 Umbilical hernia without obstruction or gangrene: Secondary | ICD-10-CM | POA: Diagnosis not present

## 2018-04-03 NOTE — Patient Instructions (Addendum)
Patient is to be scheduled for surgery with Dr.Pabon.   Call the office for any questions or concerns.  The patient is scheduled [facility]. She will pre admit by phone. The patient is aware of date and instructions.

## 2018-04-03 NOTE — Progress Notes (Signed)
  Surgical Consultation  04/03/2018  Katherine PitterDiana Berg Katherine Berg is an 32 y.o. female.   Chief Complaint  Patient presents with  . Follow-up    new to office 2 week f/u [facility]     HPI: Katherine Berg is following up for a symptomatic umbilical hernia.  Ports intermittent sharp pains around the periumbilical area.  Pain is mild to moderate intensity and worsening with Valsalva.  No fevers no chills no previous abdominal operations.  I have personally reviewed the CT scan showing about a 1/2 cm umbilical hernia.  No other acute abdominal pathology.  She did have also an ultrasound of the right upper quadrant that I have personally reviewed showing some liver steatosis but no evidence of stones. Is able to perform more than 4 METS of activity without any shortness of breath or chest pain.  History reviewed. No pertinent past medical history.  Past Surgical History:  Procedure Laterality Date  . ESOPHAGOGASTRODUODENOSCOPY Left 03/21/2018   Procedure: ESOPHAGOGASTRODUODENOSCOPY (EGD);  Surgeon: Pasty Spillersahiliani, Varnita B, MD;  [facility];  Service: Endoscopy;  Laterality: Left;    History reviewed. No pertinent family history.  Social History:  reports that she has never smoked. She has never used smokeless tobacco. She reports that she drinks alcohol. She reports that she does not use drugs.  Allergies: No Known Allergies  Medications reviewed.     ROS Full ROS performed and is otherwise negative other than what is stated in the HPI    BP 124/78   Pulse 80   Temp (!) 97.3 F (36.3 C) (Temporal)   Resp 18   Ht 5\' 2"  (1.575 m)   Wt 160 lb 6.4 oz (72.8 kg)   LMP 03/17/2018 (Exact Date) Comment: pt signed pregnancy waiver.  SpO2 98%   BMI 29.34 kg/m   Physical Exam  Constitutional: She is oriented to person, place, and time. She appears well-developed and well-nourished. No distress.  Neck: Normal range of motion. No JVD present. No tracheal deviation  present. No thyromegaly present.  Cardiovascular: Normal rate and regular rhythm.  Pulmonary/Chest: Effort normal and breath sounds normal. No stridor. No respiratory distress.  Abdominal: Soft. She exhibits no distension and no mass. There is no tenderness. There is no rebound and no guarding. A hernia is present.  Reducible UH mildly tender  Neurological: She is alert and oriented to person, place, and time. She displays normal reflexes. No cranial nerve deficit or sensory deficit. She exhibits normal muscle tone. Coordination normal.  Skin: Skin is warm and dry. Capillary refill takes less than 2 seconds.  Psychiatric: She has a normal mood and affect. Her behavior is normal. Judgment and thought content normal.  Nursing note and vitals reviewed.     Assessment/Plan: 1. Umbilical hernia without obstruction and without gangrene Helical hernia that is symptomatic.  Discussed with the patient detail about my recommendation for repair.  Given that this is small hernia I think she is better served with an open umbilical hernia repair with a small mesh.  Discussed with patient detail about the procedure.  Risk benefit and possible medications including but not limited to: Bleeding, infection, mesh issues, chronic pain and recurrence.  She understands and wishes to proceed.  She will take a look at her schedule and let us know when it will be convenient for her.  We will plan for open repair of umbilical hernia in the near future  Sterling Bigiego Okechukwu Regnier, MD [facility] General Surgeon

## 2018-04-20 ENCOUNTER — Telehealth: Payer: Self-pay

## 2018-04-20 NOTE — Telephone Encounter (Signed)
The patient will pre admit at the hospital on 05/05/18 [facility] 9[facility]. She is aware of date and time.

## 2018-05-05 ENCOUNTER — Inpatient Hospital Stay: Admission: RE | Admit: 2018-05-05 | Payer: 59 | Source: Ambulatory Visit

## 2018-05-08 ENCOUNTER — Encounter
Admission: RE | Admit: 2018-05-08 | Discharge: 2018-05-08 | Disposition: A | Discharge status: Home / Self Care | Payer: Managed Care, Other (non HMO) | Source: Ambulatory Visit | Attending: Surgery | Admitting: Surgery

## 2018-05-08 ENCOUNTER — Other Ambulatory Visit: Payer: Self-pay

## 2018-05-08 DIAGNOSIS — Z01812 Encounter for preprocedural laboratory examination: Secondary | ICD-10-CM | POA: Insufficient documentation

## 2018-05-08 HISTORY — DX: Umbilical hernia without obstruction or gangrene: K42.9

## 2018-05-08 HISTORY — DX: Vitamin D deficiency, unspecified: E55.9

## 2018-05-08 NOTE — Patient Instructions (Signed)
Your procedure is scheduled on: Tuesday, May 16, 2018 Report to Day Surgery on the 2nd floor of the [facility] To find out your arrival time, please call [phone] between 1PM - 3PM on: Monday, May 15, 2018  REMEMBER: Instructions that are not followed completely may result in serious medical risk, up to and including death; or upon the discretion of your surgeon and anesthesiologist your surgery may need to be rescheduled.  Do not eat food after midnight the night before surgery.  No gum chewing, lozengers or hard candies.  You may however, drink CLEAR liquids up to 2 hours before you are scheduled to arrive for your surgery. Do not drink anything within 2 hours of the start of your surgery.  Clear liquids include: - water  - apple juice without pulp - gatorade - black coffee or tea (Do NOT add milk or creamers to the coffee or tea) Do NOT drink anything that is not on this list.  No Alcohol for 24 hours before or after surgery.  No Smoking including e-cigarettes for 24 hours prior to surgery.  No chewable tobacco products for at least 6 hours prior to surgery.  No nicotine patches on the day of surgery.  On the morning of surgery brush your teeth with toothpaste and water, you may rinse your mouth with mouthwash if you wish. Do not swallow any toothpaste or mouthwash.  Notify your doctor if there is any change in your medical condition (cold, fever, infection).  Do not wear jewelry, make-up, hairpins, clips or nail polish.  Do not wear lotions, powders, or perfumes.   Do not shave 48 hours prior to surgery.   Contacts and dentures may not be worn into surgery.  Do not bring valuables to the hospital, including drivers license, insurance or credit cards.  [facility] is not responsible for any belongings or valuables.   TAKE THESE MEDICATIONS THE MORNING OF SURGERY:  none  Use CHG Soap as directed on instruction sheet.  NOW!  Stop Anti-inflammatories  (NSAIDS) such as Advil, Aleve, Ibuprofen, Motrin, Naproxen, Naprosyn and Aspirin based products such as Excedrin, Goodys Powder, BC Powder. (May take Tylenol or Acetaminophen if needed.)  NOW!  Stop ANY OVER THE COUNTER supplements until after surgery. (May continue Vitamin D.)  Wear comfortable clothing (specific to your surgery type) to the hospital.  If you are being discharged the day of surgery, you will not be allowed to drive home. You will need a responsible adult to drive you home and stay with you that night.   If you are taking public transportation, you will need to have a responsible adult with you. Please confirm with your physician that it is acceptable to use public transportation.   Please call [phone] if you have any questions about these instructions.

## 2018-05-15 MED ORDER — CEFAZOLIN SODIUM-DEXTROSE 2-4 GM/100ML-% IV SOLN
2.0000 g | INTRAVENOUS | Status: AC
  Administered 2018-05-16 (×2): 2 g via INTRAVENOUS

## 2018-05-16 ENCOUNTER — Encounter: Payer: Self-pay | Admitting: *Deleted

## 2018-05-16 ENCOUNTER — Ambulatory Visit
Admission: RE | Admit: 2018-05-16 | Discharge: 2018-05-16 | Disposition: A | Discharge status: Home / Self Care | Payer: Managed Care, Other (non HMO) | Attending: Surgery | Admitting: Surgery

## 2018-05-16 ENCOUNTER — Encounter
Admission: RE | Disposition: A | Discharge status: Home / Self Care | Payer: Self-pay | Source: Home / Self Care | Attending: Surgery

## 2018-05-16 ENCOUNTER — Other Ambulatory Visit: Payer: Self-pay

## 2018-05-16 ENCOUNTER — Ambulatory Visit: Payer: Managed Care, Other (non HMO) | Admitting: Anesthesiology

## 2018-05-16 DIAGNOSIS — K429 Umbilical hernia without obstruction or gangrene: Secondary | ICD-10-CM | POA: Diagnosis present

## 2018-05-16 HISTORY — PX: UMBILICAL HERNIA REPAIR: SHX196

## 2018-05-16 LAB — POCT PREGNANCY, URINE: Preg Test, Ur: NEGATIVE

## 2018-05-16 SURGERY — REPAIR, HERNIA, UMBILICAL, ADULT
Anesthesia: General

## 2018-05-16 MED ORDER — ONDANSETRON HCL 4 MG/2ML IJ SOLN
INTRAMUSCULAR | Status: AC
  Administered 2018-05-16: 4 mg via INTRAVENOUS
  Filled 2018-05-16: qty 2

## 2018-05-16 MED ORDER — ACETAMINOPHEN 500 MG PO TABS
ORAL_TABLET | ORAL | Status: AC
  Administered 2018-05-16: 1000 mg via ORAL
  Filled 2018-05-16: qty 2

## 2018-05-16 MED ORDER — PROPOFOL 10 MG/ML IV BOLUS
INTRAVENOUS | Status: DC | PRN
  Administered 2018-05-16: 150 mg via INTRAVENOUS

## 2018-05-16 MED ORDER — HYDROCODONE-ACETAMINOPHEN 5-325 MG PO TABS
ORAL_TABLET | ORAL | Status: AC
  Filled 2018-05-16: qty 1

## 2018-05-16 MED ORDER — CHLORHEXIDINE GLUCONATE CLOTH 2 % EX PADS: 6.0000 | MEDICATED_PAD | Freq: Once | CUTANEOUS | Status: DC

## 2018-05-16 MED ORDER — LACTATED RINGERS IV SOLN
INTRAVENOUS | Status: DC
  Administered 2018-05-16 (×3): via INTRAVENOUS

## 2018-05-16 MED ORDER — GABAPENTIN 300 MG PO CAPS
ORAL_CAPSULE | ORAL | Status: AC
  Administered 2018-05-16: 300 mg via ORAL
  Filled 2018-05-16: qty 1

## 2018-05-16 MED ORDER — ACETAMINOPHEN 500 MG PO TABS
1000.0000 mg | ORAL_TABLET | ORAL | Status: AC
  Administered 2018-05-16: 1000 mg via ORAL

## 2018-05-16 MED ORDER — HYDROMORPHONE HCL 1 MG/ML IJ SOLN
INTRAMUSCULAR | Status: AC
  Administered 2018-05-16: 0.25 mg via INTRAVENOUS
  Filled 2018-05-16: qty 1

## 2018-05-16 MED ORDER — MIDAZOLAM HCL 2 MG/2ML IJ SOLN
INTRAMUSCULAR | Status: AC
  Filled 2018-05-16: qty 2

## 2018-05-16 MED ORDER — PROPOFOL 500 MG/50ML IV EMUL
INTRAVENOUS | Status: AC
  Filled 2018-05-16: qty 50

## 2018-05-16 MED ORDER — DEXAMETHASONE SODIUM PHOSPHATE 10 MG/ML IJ SOLN
INTRAMUSCULAR | Status: DC | PRN
  Administered 2018-05-16: 8 mg via INTRAVENOUS

## 2018-05-16 MED ORDER — FENTANYL CITRATE (PF) 100 MCG/2ML IJ SOLN
INTRAMUSCULAR | Status: AC
  Administered 2018-05-16: 25 ug via INTRAVENOUS
  Filled 2018-05-16: qty 2

## 2018-05-16 MED ORDER — FAMOTIDINE 20 MG PO TABS
20.0000 mg | ORAL_TABLET | Freq: Once | ORAL | Status: AC
  Administered 2018-05-16: 20 mg via ORAL

## 2018-05-16 MED ORDER — SCOPOLAMINE 1 MG/3DAYS TD PT72
1.0000 | MEDICATED_PATCH | TRANSDERMAL | Status: DC
  Administered 2018-05-16: 1.5 mg via TRANSDERMAL

## 2018-05-16 MED ORDER — EPHEDRINE SULFATE 50 MG/ML IJ SOLN
INTRAMUSCULAR | Status: AC
  Filled 2018-05-16: qty 1

## 2018-05-16 MED ORDER — LIDOCAINE HCL (CARDIAC) PF 100 MG/5ML IV SOSY
PREFILLED_SYRINGE | INTRAVENOUS | Status: DC | PRN
  Administered 2018-05-16: 80 mg via INTRAVENOUS

## 2018-05-16 MED ORDER — BUPIVACAINE-EPINEPHRINE 0.25% -1:200000 IJ SOLN
INTRAMUSCULAR | Status: DC | PRN
  Administered 2018-05-16: 30 mL

## 2018-05-16 MED ORDER — CELECOXIB 200 MG PO CAPS
ORAL_CAPSULE | ORAL | Status: AC
  Administered 2018-05-16: 200 mg via ORAL
  Filled 2018-05-16: qty 1

## 2018-05-16 MED ORDER — PROPOFOL 10 MG/ML IV BOLUS
INTRAVENOUS | Status: AC
  Filled 2018-05-16: qty 20

## 2018-05-16 MED ORDER — HYDROMORPHONE HCL 1 MG/ML IJ SOLN
0.2500 mg | INTRAMUSCULAR | Status: DC | PRN
  Administered 2018-05-16 (×4): 0.25 mg via INTRAVENOUS

## 2018-05-16 MED ORDER — BUPIVACAINE-EPINEPHRINE (PF) 0.25% -1:200000 IJ SOLN
INTRAMUSCULAR | Status: AC
  Filled 2018-05-16: qty 30

## 2018-05-16 MED ORDER — FENTANYL CITRATE (PF) 100 MCG/2ML IJ SOLN
INTRAMUSCULAR | Status: AC
  Filled 2018-05-16: qty 2

## 2018-05-16 MED ORDER — CEFAZOLIN SODIUM-DEXTROSE 2-4 GM/100ML-% IV SOLN
INTRAVENOUS | Status: AC
  Filled 2018-05-16: qty 100

## 2018-05-16 MED ORDER — MIDAZOLAM HCL 2 MG/2ML IJ SOLN
INTRAMUSCULAR | Status: DC | PRN
  Administered 2018-05-16: 2 mg via INTRAVENOUS

## 2018-05-16 MED ORDER — ONDANSETRON HCL 4 MG/2ML IJ SOLN
4.0000 mg | Freq: Once | INTRAMUSCULAR | Status: AC | PRN
  Administered 2018-05-16: 4 mg via INTRAVENOUS

## 2018-05-16 MED ORDER — FAMOTIDINE 20 MG PO TABS
ORAL_TABLET | ORAL | Status: AC
  Administered 2018-05-16: 20 mg via ORAL
  Filled 2018-05-16: qty 1

## 2018-05-16 MED ORDER — HYDROCODONE-ACETAMINOPHEN 5-325 MG PO TABS: 1.0000 | ORAL_TABLET | Freq: Four times a day (QID) | ORAL | 0 refills | Status: DC | PRN

## 2018-05-16 MED ORDER — ONDANSETRON HCL 4 MG/2ML IJ SOLN
INTRAMUSCULAR | Status: DC | PRN
  Administered 2018-05-16: 4 mg via INTRAVENOUS

## 2018-05-16 MED ORDER — GABAPENTIN 300 MG PO CAPS
300.0000 mg | ORAL_CAPSULE | ORAL | Status: AC
  Administered 2018-05-16: 300 mg via ORAL

## 2018-05-16 MED ORDER — FENTANYL CITRATE (PF) 100 MCG/2ML IJ SOLN
25.0000 ug | INTRAMUSCULAR | Status: AC | PRN
  Administered 2018-05-16 (×6): 25 ug via INTRAVENOUS

## 2018-05-16 MED ORDER — CELECOXIB 200 MG PO CAPS
200.0000 mg | ORAL_CAPSULE | ORAL | Status: AC
  Administered 2018-05-16: 200 mg via ORAL

## 2018-05-16 MED ORDER — ROCURONIUM BROMIDE 100 MG/10ML IV SOLN
INTRAVENOUS | Status: DC | PRN
  Administered 2018-05-16: 40 mg via INTRAVENOUS

## 2018-05-16 MED ORDER — HYDROCODONE-ACETAMINOPHEN 5-325 MG PO TABS
1.0000 | ORAL_TABLET | Freq: Once | ORAL | Status: AC
  Administered 2018-05-16: 1 via ORAL

## 2018-05-16 MED ORDER — LIDOCAINE HCL (PF) 2 % IJ SOLN
INTRAMUSCULAR | Status: AC
  Filled 2018-05-16: qty 10

## 2018-05-16 MED ORDER — HYDROCODONE-ACETAMINOPHEN 5-325 MG PO TABS: 1.0000 | ORAL_TABLET | Freq: Once | ORAL | Status: DC

## 2018-05-16 MED ORDER — SCOPOLAMINE 1 MG/3DAYS TD PT72
MEDICATED_PATCH | TRANSDERMAL | Status: AC
  Filled 2018-05-16: qty 1

## 2018-05-16 MED ORDER — FENTANYL CITRATE (PF) 100 MCG/2ML IJ SOLN
INTRAMUSCULAR | Status: DC | PRN
  Administered 2018-05-16 (×2): 50 ug via INTRAVENOUS

## 2018-05-16 MED ORDER — SUGAMMADEX SODIUM 500 MG/5ML IV SOLN
INTRAVENOUS | Status: DC | PRN
  Administered 2018-05-16: 143.2 mg via INTRAVENOUS

## 2018-05-16 SURGICAL SUPPLY — 26 items
APPLIER CLIP 11 MED OPEN (CLIP) ×3
BLADE CLIPPER SURG (BLADE) ×3 IMPLANT
BLADE SURG 15 STRL LF DISP TIS (BLADE) ×1 IMPLANT
BLADE SURG 15 STRL SS (BLADE) ×2
CANISTER SUCT 1200ML W/VALVE (MISCELLANEOUS) ×3 IMPLANT
CHLORAPREP W/TINT 26ML (MISCELLANEOUS) ×3 IMPLANT
CLIP APPLIE 11 MED OPEN (CLIP) ×1 IMPLANT
COVER WAND RF STERILE (DRAPES) ×3 IMPLANT
DRAPE INCISE IOBAN 66X45 STRL (DRAPES) ×3 IMPLANT
DRAPE LAPAROTOMY 77X122 PED (DRAPES) ×3 IMPLANT
DRSG TELFA 3X8 NADH (GAUZE/BANDAGES/DRESSINGS) ×3 IMPLANT
ELECT CAUTERY BLADE 6.4 (BLADE) ×3 IMPLANT
ELECT REM PT RETURN 9FT ADLT (ELECTROSURGICAL) ×3
ELECTRODE REM PT RTRN 9FT ADLT (ELECTROSURGICAL) ×1 IMPLANT
GLOVE BIO SURGEON STRL SZ7 (GLOVE) ×3 IMPLANT
GOWN STRL REUS W/ TWL LRG LVL3 (GOWN DISPOSABLE) ×2 IMPLANT
GOWN STRL REUS W/TWL LRG LVL3 (GOWN DISPOSABLE) ×4
NEEDLE HYPO 22GX1.5 SAFETY (NEEDLE) ×3 IMPLANT
NS IRRIG 500ML POUR BTL (IV SOLUTION) ×3 IMPLANT
PACK BASIN MINOR ARMC (MISCELLANEOUS) ×3 IMPLANT
SPONGE LAP 18X18 RF (DISPOSABLE) ×3 IMPLANT
SUT ETHIBOND NAB MO 7 #0 18IN (SUTURE) ×3 IMPLANT
SUT MNCRL AB 4-0 PS2 18 (SUTURE) ×3 IMPLANT
SUT VIC AB 3-0 SH 27 (SUTURE) ×2
SUT VIC AB 3-0 SH 27X BRD (SUTURE) ×1 IMPLANT
SYR 20CC LL (SYRINGE) ×3 IMPLANT

## 2018-05-16 NOTE — H&P (Signed)
  Chief Complaint  Patient presents with  . Follow-up    new to office 2 week f/u from EGD done at [facility]-     HPI: Katherine Berg is following up for a symptomatic umbilical hernia. RePorts intermittent sharp pains around the periumbilical area.  Pain is mild to moderate intensity and worsening with Valsalva.  No fevers no chills no previous abdominal operations.  I have personally reviewed the CT scan showing about a 1/2 cm umbilical hernia.  No other acute abdominal pathology.  She did have also an ultrasound of the right upper quadrant that I have personally reviewed showing some liver steatosis but no evidence of stones. Is able to perform more than 4 METS of activity without any shortness of breath or chest pain.  History reviewed. No pertinent past medical history.       Past Surgical History:  Procedure Laterality Date  . ESOPHAGOGASTRODUODENOSCOPY Left 03/21/2018   Procedure: ESOPHAGOGASTRODUODENOSCOPY (EGD);  Surgeon: Pasty Spillers, MD;  Location: [facility] ENDOSCOPY;  Service: Endoscopy;  Laterality: Left;    History reviewed. No pertinent family history.  Social History:  reports that she has never smoked. She has never used smokeless tobacco. She reports that she drinks alcohol. She reports that she does not use drugs.  Allergies: No Known Allergies  Medications reviewed.     ROS Full ROS performed and is otherwise negative other than what is stated in the HPI     Physical Exam  Constitutional: She is oriented to person, place, and time. She appears well-developed and well-nourished. No distress.  Neck: Normal range of motion. No JVD present. No tracheal deviation present. No thyromegaly present.  Cardiovascular: Normal rate and regular rhythm.  Pulmonary/Chest: Effort normal and breath sounds normal. No stridor. No respiratory distress.  Abdominal: Soft. She exhibits no distension and no mass. There is no tenderness. There is no rebound and no  guarding. A hernia is present.  Reducible UH mildly tender  Neurological: She is alert and oriented to person, place, and time. She displays normal reflexes. No cranial nerve deficit or sensory deficit. She exhibits normal muscle tone. Coordination normal.  Skin: Skin is warm and dry. Capillary refill takes less than 2 seconds.  Psychiatric: She has a normal mood and affect. Her behavior is normal. Judgment and thought content normal.  Nursing note and vitals reviewed.     Assessment/Plan: 1. Umbilical hernia without obstruction and without gangrene umbilicall hernia that is symptomatic.  Discussed with the patient detail about my recommendation for repair.  Given that this is small hernia I think she is better served with an open umbilical hernia repair with a small mesh.  Discussed with patient detail about the procedure.  Risk benefit and possible medications including but not limited to: Bleeding, infection, mesh issues, chronic pain and recurrence.  She understands and wishes to proceed.    We will plan for open repair of umbilical hernia.  Sterling Big, MD FACS

## 2018-05-16 NOTE — OR Nursing (Signed)
Dr. Noralyn Pick notified that nausea persists without vomiting despite peppermint oil and Zofran.  Ordered Scopalamine.

## 2018-05-16 NOTE — Anesthesia Procedure Notes (Signed)
Procedure Name: Intubation Date/Time: 05/16/2018 10:00 AM Performed by: Allean Found, CRNA Pre-anesthesia Checklist: Patient identified, Patient being monitored, Timeout performed, Emergency Drugs available and Suction available Patient Re-evaluated:Patient Re-evaluated prior to induction Oxygen Delivery Method: Circle system utilized Preoxygenation: Pre-oxygenation with 100% oxygen Induction Type: IV induction Ventilation: Mask ventilation without difficulty Laryngoscope Size: Mac and 3 Grade View: Grade I Tube type: Oral Tube size: 7.0 mm Number of attempts: 1 Airway Equipment and Method: Stylet Placement Confirmation: ETT inserted through vocal cords under direct vision,  positive ETCO2 and breath sounds checked- equal and bilateral Secured at: 21 cm Tube secured with: Tape Dental Injury: Teeth and Oropharynx as per pre-operative assessment

## 2018-05-16 NOTE — Transfer of Care (Signed)
Immediate Anesthesia Transfer of Care Note  Patient: Katherine Berg  Procedure(s) Performed: OPEN HERNIA REPAIR UMBILICAL ADULT (N/A )  Patient Location: PACU  Anesthesia Type:General  Level of Consciousness: awake and oriented  Airway & Oxygen Therapy: Patient Spontanous Breathing and Patient connected to nasal cannula oxygen  Post-op Assessment: Report given to RN and Post -op Vital signs reviewed and stable  Post vital signs: Reviewed and stable  Last Vitals:  Vitals Value Taken Time  BP 100/53 05/16/2018 11:02 AM  Temp 37.4 C 05/16/2018 11:00 AM  Pulse 81 05/16/2018 11:03 AM  Resp 17 05/16/2018 11:03 AM  SpO2 100 % 05/16/2018 11:03 AM  Vitals shown include unvalidated device data.  Last Pain:  Vitals:   05/16/18 0840  TempSrc: Tympanic  PainSc: 0-No pain         Complications: No apparent anesthesia complications

## 2018-05-16 NOTE — OR Nursing (Signed)
Dr. Everlene Farrier notified that patient is requesting an abdominal binder.  He approved it and it was applied.  Patient states she is much more comfortable with the binder.

## 2018-05-16 NOTE — OR Nursing (Signed)
Remains somewhat anxious and tearful and complains of numbness of hands and feet.  No discoloration.  Warm to touch.  Added warm blankets.  States nausea slightly better

## 2018-05-16 NOTE — Op Note (Signed)
Primary Umbilical Hernia Repair  Pre-operative Diagnosis: Symptomatic umbilical hernia  Post-operative Diagnosis: same  Surgeon: Sterling Big, MD FACS  Anesthesia: Gen. with endotracheal tube  Findings: 1.5 cm UH  Estimated Blood Loss: 10cc         Drains: none         Specimens: sac          Complications: none              Procedure Details  The patient was seen again in the Holding Room. The benefits, complications, treatment options, and expected outcomes were discussed with the patient. The risks of bleeding, infection, recurrence of symptoms, failure to resolve symptoms, bowel injury, mesh placement, mesh infection, any of which could require further surgery were reviewed with the patient. The likelihood of improving the patient's symptoms with return to their baseline status is good.  The patient and/or family concurred with the proposed plan, giving informed consent.  The patient was taken to Operating Room, identified as Katherine Berg and the procedure verified.  A Time Out was held and the above information confirmed.  Prior to the induction of general anesthesia, antibiotic prophylaxis was administered. VTE prophylaxis was in place. General endotracheal anesthesia was then administered and tolerated well. After the induction, the abdomen was prepped with Chloraprep and draped in the sterile fashion. The patient was positioned in the supine position.   Incision was created with a scalpel over the hernia defect. Electrocautery was used to dissect through subcutaneous tissue, the hernia sac was opened and the adhesions were  Tdivided with Metzenbaum's scissors. Hernia sac was excised. The hernia was measured 1.5 cms diameter.  I closed the hernia defect with interrupted 0 Ethibond sutures.  Incisions were closed in a 2 layer fashion with 3-0 Vicryl and 4-0 Monocryl. Dermabond was used to coat the skin. Marcaine quarter percent with epinephrine and lidocaine 1% was  used to inject all the incision sites. Patient tolerated procedure well and there were no immediate complications. Needle and laparotomy counts were correct   Sterling Big, MD, FACS

## 2018-05-16 NOTE — Discharge Instructions (Addendum)
AMBULATORY SURGERY  °DISCHARGE INSTRUCTIONS ° ° °1) The drugs that you were given will stay in your system until tomorrow so for the next 24 hours you should not: ° °A) Drive an automobile °B) Make any legal decisions °C) Drink any alcoholic beverage ° ° °2) You may resume regular meals tomorrow.  Today it is better to start with liquids and gradually work up to solid foods. ° °You may eat anything you prefer, but it is better to start with liquids, then soup and crackers, and gradually work up to solid foods. ° ° °3) Please notify your doctor immediately if you have any unusual bleeding, trouble breathing, redness and pain at the surgery site, drainage, fever, or pain not relieved by medication. ° ° ° °4) Additional Instructions: ° °Please contact your physician with any problems or Same Day Surgery at [phone], Monday through Friday 6 am to 4 pm, or  at [facility] Main number at [phone]. ° °Open Hernia Repair, Adult, Care After °These instructions give you information about caring for yourself after your procedure. Your doctor may also give you more specific instructions. If you have problems or questions, contact your doctor. °Follow these instructions at home: °Surgical cut (incision) care ° °· Follow instructions from your doctor about how to take care of your surgical cut area. Make sure you: °? Wash your hands with soap and water before you change your bandage (dressing). If you cannot use soap and water, use hand sanitizer. °? Change your bandage as told by your doctor. °? Leave stitches (sutures), skin glue, or skin tape (adhesive) strips in place. They may need to stay in place for 2 weeks or longer. If tape strips get loose and curl up, you may trim the loose edges. Do not remove tape strips completely unless your doctor says it is okay. °· Check your surgical cut every day for signs of infection. Check for: °? More redness, swelling, or pain. °? More fluid or blood. °? Warmth. °? Pus or  a bad smell. °Activity °· Do not drive or use heavy machinery while taking prescription pain medicine. Do not drive until your doctor says it is okay. °· Until your doctor says it is okay: °? Do not lift anything that is heavier than 10 lb (4.5 kg). °? Do not play contact sports. °· Return to your normal activities as told by your doctor. Ask your doctor what activities are safe. °General instructions °· To prevent or treat having a hard time pooping (constipation) while you are taking prescription pain medicine, your doctor may recommend that you: °? Drink enough fluid to keep your pee (urine) clear or pale yellow. °? Take over-the-counter or prescription medicines. °? Eat foods that are high in fiber, such as fresh fruits and vegetables, whole grains, and beans. °? Limit foods that are high in fat and processed sugars, such as fried and sweet foods. °· Take over-the-counter and prescription medicines only as told by your doctor. °· Do not take baths, swim, or use a hot tub until your doctor says it is okay. °· Keep all follow-up visits as told by your doctor. This is important. °Contact a doctor if: °· You develop a rash. °· You have more redness, swelling, or pain around your surgical cut. °· You have more fluid or blood coming from your surgical cut. °· Your surgical cut feels warm to the touch. °· You have pus or a bad smell coming from your surgical cut. °· You have a fever   or chills. °· You have blood in your poop (stool). °· You have not pooped in 2-3 days. °· Medicine does not help your pain. °Get help right away if: °· You have chest pain or you are short of breath. °· You feel light-headed. °· You feel weak and dizzy (feel faint). °· You have very bad pain. °· You throw up (vomit) and your pain is worse. °This information is not intended to replace advice given to you by your health care provider. Make sure you discuss any questions you have with your health care provider. °Document Released: 05/03/2014  Document Revised: 10/31/2015 Document Reviewed: 09/24/2015 °Elsevier Interactive Patient Education © 2019 [employer] ° °

## 2018-05-16 NOTE — Anesthesia Preprocedure Evaluation (Addendum)
Anesthesia Evaluation  Patient identified by MRN, date of birth, ID band Patient awake    Reviewed: Allergy & Precautions, H&P , NPO status , Patient's Chart, lab work & pertinent test results, reviewed documented beta blocker date and time   History of Anesthesia Complications Negative for: history of anesthetic complications  Airway Mallampati: I  TM Distance: >3 FB Neck ROM: full    Dental  (+) Dental Advidsory Given, Teeth Intact   Pulmonary neg pulmonary ROS, neg shortness of breath, neg sleep apnea, neg COPD,           Cardiovascular Exercise Tolerance: Good negative cardio ROS       Neuro/Psych negative neurological ROS  negative psych ROS   GI/Hepatic Neg liver ROS, GERD  ,  Endo/Other  negative endocrine ROS  Renal/GU negative Renal ROS  negative genitourinary   Musculoskeletal   Abdominal   Peds negative pediatric ROS (+)  Hematology negative hematology ROS (+)   Anesthesia Other Findings History reviewed. No pertinent past medical history.   Reproductive/Obstetrics negative OB ROS                             Anesthesia Physical  Anesthesia Plan  ASA: II  Anesthesia Plan: General   Post-op Pain Management:    Induction: Intravenous  PONV Risk Score and Plan: 3  Airway Management Planned: Oral ETT  Additional Equipment:   Intra-op Plan:   Post-operative Plan: Extubation in OR  Informed Consent: I have reviewed the patients History and Physical, chart, labs and discussed the procedure including the risks, benefits and alternatives for the proposed anesthesia with the patient or authorized representative who has indicated his/her understanding and acceptance.     Dental Advisory Given  Plan Discussed with: Anesthesiologist, CRNA and Surgeon  Anesthesia Plan Comments:        Anesthesia Quick Evaluation

## 2018-05-16 NOTE — Anesthesia Post-op Follow-up Note (Signed)
Anesthesia QCDR form completed.        

## 2018-05-17 ENCOUNTER — Telehealth: Payer: Self-pay | Admitting: Surgery

## 2018-05-17 LAB — SURGICAL PATHOLOGY

## 2018-05-17 MED ORDER — ONDANSETRON HCL 4 MG PO TABS: 4.0000 mg | ORAL_TABLET | Freq: Four times a day (QID) | ORAL | 0 refills | Status: DC | PRN

## 2018-05-17 NOTE — Telephone Encounter (Signed)
May try breaking the pain medication in half or she can try Advil tylenol or aleve for the pain. Zofran 4mg  po q6h prn nausea # 20 if needed for the nausea can be sent in to pharmacy.

## 2018-05-17 NOTE — Telephone Encounter (Signed)
Notified patient as instructed, patient pleased. Discussed follow-up appointments, patient agrees. RX sent on for Zofran to [facility]

## 2018-05-17 NOTE — Anesthesia Postprocedure Evaluation (Signed)
Anesthesia Post Note  Patient: Katherine Berg  Procedure(s) Performed: OPEN HERNIA REPAIR UMBILICAL ADULT (N/A )  Patient location during evaluation: PACU Anesthesia Type: General Level of consciousness: awake and alert and oriented Pain management: pain level controlled Vital Signs Assessment: post-procedure vital signs reviewed and stable Respiratory status: spontaneous breathing Cardiovascular status: blood pressure returned to baseline Anesthetic complications: no     Last Vitals:  Vitals:   05/16/18 1302 05/16/18 1454  BP:  (!) 105/58  Pulse: 62 69  Resp: 16 16  Temp:    SpO2: 100% 100%    Last Pain:  Vitals:   05/17/18 0854  TempSrc:   PainSc: 5                  Shyah Cadmus

## 2018-05-17 NOTE — Telephone Encounter (Signed)
Patient is calling and said the pain medication is making her sick and is asking if there is another type of medication that  She won't get sick from. Please call the patient and advise.

## 2018-05-21 ENCOUNTER — Telehealth: Payer: Self-pay | Admitting: General Surgery

## 2018-05-21 NOTE — Telephone Encounter (Signed)
Patient called referring she had a fever of 100.8 yesterday. She refers that she has associated generalized joint pain. Denies nausea or vomiting. Refer tolerating diet without vomiting or diarrhea. Refers that the wound looks good without drainage or erythema. Minimal pain on the umbilical wound.  Patient refers that this morning woke up feeling better.   Oriented that if the fever recurs or does not feels better will need to come to ED for evaluation. Most likely having flu like symptoms.   Patient had umbilical hernia repair without mesh on 05/16/2018. As per symptoms, it does not seem to be wound infection. Will notify [facility] office to follow up if she does not need to come to ED.

## 2018-05-22 ENCOUNTER — Ambulatory Visit: Payer: 59 | Admitting: Gastroenterology

## 2018-05-22 ENCOUNTER — Ambulatory Visit (INDEPENDENT_AMBULATORY_CARE_PROVIDER_SITE_OTHER): Payer: Managed Care, Other (non HMO) | Admitting: Gastroenterology

## 2018-05-22 ENCOUNTER — Encounter: Payer: Self-pay | Admitting: Gastroenterology

## 2018-05-22 VITALS — BP 102/68 | HR 82 | Ht 62.0 in | Wt 158.2 lb

## 2018-05-22 DIAGNOSIS — Z8619 Personal history of other infectious and parasitic diseases: Secondary | ICD-10-CM

## 2018-05-22 DIAGNOSIS — K5900 Constipation, unspecified: Secondary | ICD-10-CM | POA: Diagnosis not present

## 2018-05-22 NOTE — Progress Notes (Signed)
Melodie Bouillon, MD [address]  [address]  [place], [place] [address]  Main: [phone]  Fax: [phone]   Primary Care Physician: [facility]   Chief Complaint  Patient presents with  . New Patient (Initial Visit)  . Follow-up    hospital f/u from November, EGD    HPI: Katherine Berg is a 33 y.o. female was recently in the hospital November 2019 with abdominal pain, nausea and vomiting.  She underwent an EGD that did not show any gastric outlet obstruction.  Gastric erythema, gastric erosions and extrinsic compression of the gastric fundus was noted.  As stated in my earlier note "I talked to the radiologist personally, and they reviewed the CT scan again, and state that there are no abnormalities causing extrinsic compression at the gastric fundus.  They stated, that the left lobe of the liver is in that area, but they do not see any structures causing extrinsic compression in that area.  At this point, would recommend primary team to consider surgery consult due to the increase in size of umbilical hernia mentioned in the CT scan.  This by itself is not likely to explain her symptoms, however.  If her symptoms continue as an outpatient, can consider EUS to evaluate the compression seen on EGD.  Await biopsy results to rule out H. pylori. "  Biopsy results showed H. pylori and so did her breath test as an inpatient.  Patient has completed triple therapy.  Patient has since undergone umbilical hernia repair last week with Dr. Elenor Legato.  Patient has been doing well since then.  Denies any abdominal pain, nausea or vomiting.  Does report constipation and hemorrhoids since the birth of her last child 4 years ago.  Reports intermittent bright red blood per rectum due to external hemorrhoids that she feels upon wiping.  Bright red blood is only on the tissue paper.  No blood in stool itself.  Family history of colon cancer with father  diagnosed with the same at 38 years of age.  Patient reports transient fever 2 nights ago at 100.8 and called surgery who recommended going to the ER if fever reoccurs.  Patient has been asymptomatic otherwise.  Did not have any other symptoms with that fever at that time.  Current Outpatient Medications  Medication Sig Dispense Refill  . acetaminophen (TYLENOL) 325 MG tablet Take 650 mg by mouth every 6 (six) hours as needed.    . Cholecalciferol (VITAMIN D3 PO) Take 1 capsule by mouth daily.    . Ibuprofen 200 MG CAPS Take by mouth as needed.    Marland Kitchen HYDROcodone-acetaminophen (NORCO/VICODIN) 5-325 MG tablet Take 1-2 tablets by mouth every 6 (six) hours as needed for moderate pain. (Patient not taking: Reported on 05/22/2018) 20 tablet 0  . ondansetron (ZOFRAN) 4 MG tablet Take 1 tablet (4 mg total) by mouth every 6 (six) hours as needed for nausea or vomiting. (Patient not taking: Reported on 05/22/2018) 20 tablet 0   No current facility-administered medications for this visit.     Allergies as of 05/22/2018  . (No Known Allergies)    ROS:  General: Negative for anorexia, weight loss, fever, chills, fatigue, weakness. ENT: Negative for hoarseness, difficulty swallowing , nasal congestion. CV: Negative for chest pain, angina, palpitations, dyspnea on exertion, peripheral edema.  Respiratory: Negative for dyspnea at rest, dyspnea on exertion, cough, sputum, wheezing.  GI: See history of present illness. GU:  Negative for dysuria, hematuria, urinary incontinence, urinary  frequency, nocturnal urination.  Endo: Negative for unusual weight change.    Physical Examination:   BP 102/68   Pulse 82   Ht 5\' 2"  (1.575 m)   Wt 158 lb 3.2 oz (71.8 kg)   LMP 04/22/2018 (Exact Date)   BMI 28.94 kg/m   General: Well-nourished, well-developed in no acute distress.  Eyes: No icterus. Conjunctivae pink. Mouth: Oropharyngeal mucosa moist and pink , no lesions erythema or exudate. Neck: Supple,  Trachea midline Abdomen: Bowel sounds are normal, nontender, nondistended, no hepatosplenomegaly or masses, no abdominal bruits or hernia , no rebound or guarding.   Extremities: No lower extremity edema. No clubbing or deformities. Neuro: Alert and oriented x 3.  Grossly intact. Skin: Warm and dry, no jaundice.   Psych: Alert and cooperative, normal mood and affect.   Labs: CMP     Component Value Date/Time   NA 140 03/21/2018 0406   NA 137 09/30/2012 1345   K 3.9 03/21/2018 0406   K 3.8 09/30/2012 1345   CL 112 (H) 03/21/2018 0406   CL 109 (H) 09/30/2012 1345   CO2 25 03/21/2018 0406   CO2 24 09/30/2012 1345   GLUCOSE 97 03/21/2018 0406   GLUCOSE 89 09/30/2012 1345   BUN 10 03/21/2018 0406   BUN 14 09/30/2012 1345   CREATININE 0.59 03/21/2018 0406   CREATININE 0.59 (L) 09/30/2012 1345   CALCIUM 8.5 (L) 03/21/2018 0406   CALCIUM 8.7 09/30/2012 1345   PROT 7.2 03/19/2018 2248   PROT 7.6 09/30/2012 1345   ALBUMIN 4.0 03/19/2018 2248   ALBUMIN 3.7 09/30/2012 1345   AST 23 03/19/2018 2248   AST 51 (H) 09/30/2012 1345   ALT 19 03/19/2018 2248   ALT 82 (H) 09/30/2012 1345   ALKPHOS 67 03/19/2018 2248   ALKPHOS 81 09/30/2012 1345   BILITOT 0.4 03/19/2018 2248   BILITOT 0.5 09/30/2012 1345   GFRNONAA >60 03/21/2018 0406   GFRNONAA >60 09/30/2012 1345   GFRAA >60 03/21/2018 0406   GFRAA >60 09/30/2012 1345   Lab Results  Component Value Date   WBC 17.8 (H) 03/19/2018   HGB 14.2 03/19/2018   HCT 40.1 03/19/2018   MCV 92.4 03/19/2018   PLT 330 03/19/2018    Imaging Studies: No results found.  Assessment and Plan:   Katherine Berg is a 33 y.o. y/o female here for hospital follow-up for abdominal pain, nausea vomiting H. pylori next  Abdominal pain nausea and vomiting have completely resolved The may have been due to her increasing umbilical hernia has no further symptoms after hernia repair.  We will obtain H. pylori eradication testing since she has  completed triple therapy  Given her intermittent bright blood per rectum due to constipation and hemorrhoids, we have discussed high-fiber diet She can use hemorrhoid ointment over-the-counter as needed We will recheck hemoglobin  We also discussed with her that her father was diagnosed with colon cancer at a very young age.  Per guidelines, colon cancer screening for her would start at 40 for high risk screening.  However, given her intermittent bright red blood per rectum and the recent increase in colon cancer in the younger population, we discussed earlier screening.  Patient is interested in this.  However, given her recent surgery will wait 2 to 3 months before scheduling colonoscopy.  Patient will follow-up with us in clinic in 2 to 3 months and schedule at that time.  High-fiber diet MiraLAX or Metamucil daily with goal of 1-2 soft bowel movements  daily.  If not at goal, patient instructed to increase dose to twice daily.  If loose stools with the medication, patient asked to decrease the medication to every other day, or half dose daily.  Patient verbalized understanding  If blood per rectum worsens I have asked her to contact us and she verbalized understanding.  Her fever has completely resolved from 2 nights ago.  If it reoccurs I have asked her to contact Dr. Hurman HornPabon's office directly and she verbalized understanding.  Dr Melodie BouillonVarnita Jamileth Putzier

## 2018-05-22 NOTE — Patient Instructions (Addendum)
Take Miralax daily.  Have labs drawn at [facility].  Follow up in 3 months.  Review patient education on Fatty Liver and High Fiber Diet.   High-Fiber Diet Fiber, also called dietary fiber, is a type of carbohydrate that is found in fruits, vegetables, whole grains, and beans. A high-fiber diet can have many health benefits. Your health care provider may recommend a high-fiber diet to help:  Prevent constipation. Fiber can make your bowel movements more regular.  Lower your cholesterol.  Relieve the following conditions: ? Swelling of veins in the anus (hemorrhoids). ? Swelling and irritation (inflammation) of specific areas of the digestive tract (uncomplicated diverticulosis). ? A problem of the large intestine (colon) that sometimes causes pain and diarrhea (irritable bowel syndrome, IBS).  Prevent overeating as part of a weight-loss plan.  Prevent heart disease, type 2 diabetes, and certain cancers. What is my plan? The recommended daily fiber intake in grams (g) includes:  38 g for men age 550 or younger.  30 g for men over age 350.  25 g for women age 32 or younger.  21 g for women over age 32. You can get the recommended daily intake of dietary fiber by:  Eating a variety of fruits, vegetables, grains, and beans.  Taking a fiber supplement, if it is not possible to get enough fiber through your diet. What do I need to know about a high-fiber diet?  It is better to get fiber through food sources rather than from fiber supplements. There is not a lot of research about how effective supplements are.  Always check the fiber content on the nutrition facts label of any prepackaged food. Look for foods that contain 5 g of fiber or more per serving.  Talk with a diet and nutrition specialist (dietitian) if you have questions about specific foods that are recommended or not recommended for your medical condition, especially if those foods are not listed below.  Gradually increase  how much fiber you consume. If you increase your intake of dietary fiber too quickly, you may have bloating, cramping, or gas.  Drink plenty of water. Water helps you to digest fiber. What are tips for following this plan?  Eat a wide variety of high-fiber foods.  Make sure that half of the grains that you eat each day are whole grains.  Eat breads and cereals that are made with whole-grain flour instead of refined flour or white flour.  Eat brown rice, bulgur wheat, or millet instead of white rice.  Start the day with a breakfast that is high in fiber, such as a cereal that contains 5 g of fiber or more per serving.  Use beans in place of meat in soups, salads, and pasta dishes.  Eat high-fiber snacks, such as berries, raw vegetables, nuts, and popcorn.  Choose whole fruits and vegetables instead of processed forms like juice or sauce. What foods can I eat?  Fruits Berries. Pears. Apples. Oranges. Avocado. Prunes and raisins. Dried figs. Vegetables Sweet potatoes. Spinach. Kale. Artichokes. Cabbage. Broccoli. Cauliflower. Green peas. Carrots. Squash. Grains Whole-grain breads. Multigrain cereal. Oats and oatmeal. Brown rice. Barley. Bulgur wheat. Millet. Quinoa. Bran muffins. Popcorn. Rye wafer crackers. Meats and other proteins Navy, kidney, and pinto beans. Soybeans. Split peas. Lentils. Nuts and seeds. Dairy Fiber-fortified yogurt. Beverages Fiber-fortified soy milk. Fiber-fortified orange juice. Other foods Fiber bars. The items listed above may not be a complete list of recommended foods and beverages. Contact a dietitian for more options. What foods are  not recommended? Fruits Fruit juice. Cooked, strained fruit. Vegetables Fried potatoes. Canned vegetables. Well-cooked vegetables. Grains White bread. Pasta made with refined flour. White rice. Meats and other proteins Fatty cuts of meat. Fried chicken or fried fish. Dairy Milk. Yogurt. Cream cheese. Sour  cream. Fats and oils Butters. Beverages Soft drinks. Other foods Cakes and pastries. The items listed above may not be a complete list of foods and beverages to avoid. Contact a dietitian for more information. Summary  Fiber is a type of carbohydrate. It is found in fruits, vegetables, whole grains, and beans.  There are many health benefits of eating a high-fiber diet, such as preventing constipation, lowering blood cholesterol, helping with weight loss, and reducing your risk of heart disease, diabetes, and certain cancers.  Gradually increase your intake of fiber. Increasing too fast can result in cramping, bloating, and gas. Drink plenty of water while you increase your fiber.  The best sources of fiber include whole fruits and vegetables, whole grains, nuts, seeds, and beans. This information is not intended to replace advice given to you by your health care provider. Make sure you discuss any questions you have with your health care provider. Document Released: 04/12/2005 Document Revised: 02/14/2017 Document Reviewed: 02/14/2017 Elsevier Interactive Patient Education  2019 Elsevier Inc.  High-Fiber Diet Fiber, also called dietary fiber, is a type of carbohydrate that is found in fruits, vegetables, whole grains, and beans. A high-fiber diet can have many health benefits. Your health care provider may recommend a high-fiber diet to help:  Prevent constipation. Fiber can make your bowel movements more regular.  Lower your cholesterol.  Relieve the following conditions: ? Swelling of veins in the anus (hemorrhoids). ? Swelling and irritation (inflammation) of specific areas of the digestive tract (uncomplicated diverticulosis). ? A problem of the large intestine (colon) that sometimes causes pain and diarrhea (irritable bowel syndrome, IBS).  Prevent overeating as part of a weight-loss plan.  Prevent heart disease, type 2 diabetes, and certain cancers. What is my plan? The  recommended daily fiber intake in grams (g) includes:  38 g for men age 3 or younger.  30 g for men over age 13.  25 g for women age 48 or younger.  21 g for women over age 95. You can get the recommended daily intake of dietary fiber by:  Eating a variety of fruits, vegetables, grains, and beans.  Taking a fiber supplement, if it is not possible to get enough fiber through your diet. What do I need to know about a high-fiber diet?  It is better to get fiber through food sources rather than from fiber supplements. There is not a lot of research about how effective supplements are.  Always check the fiber content on the nutrition facts label of any prepackaged food. Look for foods that contain 5 g of fiber or more per serving.  Talk with a diet and nutrition specialist (dietitian) if you have questions about specific foods that are recommended or not recommended for your medical condition, especially if those foods are not listed below.  Gradually increase how much fiber you consume. If you increase your intake of dietary fiber too quickly, you may have bloating, cramping, or gas.  Drink plenty of water. Water helps you to digest fiber. What are tips for following this plan?  Eat a wide variety of high-fiber foods.  Make sure that half of the grains that you eat each day are whole grains.  Eat breads and cereals that are  made with whole-grain flour instead of refined flour or white flour.  Eat brown rice, bulgur wheat, or millet instead of white rice.  Start the day with a breakfast that is high in fiber, such as a cereal that contains 5 g of fiber or more per serving.  Use beans in place of meat in soups, salads, and pasta dishes.  Eat high-fiber snacks, such as berries, raw vegetables, nuts, and popcorn.  Choose whole fruits and vegetables instead of processed forms like juice or sauce. What foods can I eat?  Fruits Berries. Pears. Apples. Oranges. Avocado. Prunes and  raisins. Dried figs. Vegetables Sweet potatoes. Spinach. Kale. Artichokes. Cabbage. Broccoli. Cauliflower. Green peas. Carrots. Squash. Grains Whole-grain breads. Multigrain cereal. Oats and oatmeal. Brown rice. Barley. Bulgur wheat. Millet. Quinoa. Bran muffins. Popcorn. Rye wafer crackers. Meats and other proteins Navy, kidney, and pinto beans. Soybeans. Split peas. Lentils. Nuts and seeds. Dairy Fiber-fortified yogurt. Beverages Fiber-fortified soy milk. Fiber-fortified orange juice. Other foods Fiber bars. The items listed above may not be a complete list of recommended foods and beverages. Contact a dietitian for more options. What foods are not recommended? Fruits Fruit juice. Cooked, strained fruit. Vegetables Fried potatoes. Canned vegetables. Well-cooked vegetables. Grains White bread. Pasta made with refined flour. White rice. Meats and other proteins Fatty cuts of meat. Fried chicken or fried fish. Dairy Milk. Yogurt. Cream cheese. Sour cream. Fats and oils Butters. Beverages Soft drinks. Other foods Cakes and pastries. The items listed above may not be a complete list of foods and beverages to avoid. Contact a dietitian for more information. Summary  Fiber is a type of carbohydrate. It is found in fruits, vegetables, whole grains, and beans.  There are many health benefits of eating a high-fiber diet, such as preventing constipation, lowering blood cholesterol, helping with weight loss, and reducing your risk of heart disease, diabetes, and certain cancers.  Gradually increase your intake of fiber. Increasing too fast can result in cramping, bloating, and gas. Drink plenty of water while you increase your fiber.  The best sources of fiber include whole fruits and vegetables, whole grains, nuts, seeds, and beans. This information is not intended to replace advice given to you by your health care provider. Make sure you discuss any questions you have with your  health care provider. Document Released: 04/12/2005 Document Revised: 02/14/2017 Document Reviewed: 02/14/2017 Elsevier Interactive Patient Education  2019 [employer]

## 2018-05-23 LAB — HEPATITIS C ANTIBODY (REFLEX): HCV Ab: <0.1 s/co ratio (ref 0.0–0.9)

## 2018-05-23 LAB — HCV COMMENT:

## 2018-05-23 LAB — HEPATITIS B SURFACE ANTIGEN: Hepatitis B Surface Ag: NEGATIVE

## 2018-05-23 LAB — HEPATITIS B SURFACE ANTIBODY,QUALITATIVE: Hep B Surface Ab, Qual: REACTIVE

## 2018-05-23 LAB — HEMOGLOBIN: Hemoglobin: 14.9 g/dL (ref 11.1–15.9)

## 2018-05-23 LAB — HEPATITIS A ANTIBODY, TOTAL: Hep A Total Ab: POSITIVE — AB

## 2018-05-23 LAB — HEPATITIS B CORE ANTIBODY, TOTAL: Hep B Core Total Ab: NEGATIVE

## 2018-05-31 ENCOUNTER — Encounter: Payer: Self-pay | Admitting: Surgery

## 2018-06-01 LAB — H. PYLORI BREATH TEST

## 2018-06-05 ENCOUNTER — Encounter: Payer: Self-pay | Admitting: Surgery

## 2018-06-14 ENCOUNTER — Telehealth: Payer: Self-pay

## 2018-06-14 DIAGNOSIS — K5900 Constipation, unspecified: Secondary | ICD-10-CM

## 2018-06-14 NOTE — Telephone Encounter (Signed)
Looks like her H. pylori test was canceled, can [facility] pylori breath test  Order: [id number]  Status:  Edited Result - FINAL Visible to patient:  Yes (MyChart) Dx:  Constipation, unspecified constipatio...  Component 3wk ago  H pylori Breath Test CANCELED   Comment: Test not performed. Specimen could not be located.  [facility] will contact your patient to schedule a time for recollection.  Should we be unable to make contact with your patient, your office  will be notified.   Result canceled by the ancillary.   Resulting Agency [facility]    Narrative  Performed by: Verdell Carmine  Performed at: [address] [place], [place] [id number] Lab Director: Jolene Schimke MD, Phone: [phone]    Specimen Collected: 05/22/18 14:17 Last Resulted: 06/01/18 07:37      Lab Flowsheet     Order Details     View Encounter     Lab and Collection Details     Routing     Result History         e reorder.   Pt states she done this test. Pt states she had her lab work done on [employer]). We  will need to f/u with lab.

## 2018-06-21 NOTE — Telephone Encounter (Signed)
Lab was canceled due to specimen could not be located. Pt did do the test at the [employer] drawing station at [employer]. Pt agreed to come to office to have this repeated and given lab hours of 1:00p to 4:00p on Monday. Lab reordered.

## 2018-08-14 ENCOUNTER — Ambulatory Visit: Payer: Managed Care, Other (non HMO) | Admitting: Gastroenterology

## 2018-10-09 ENCOUNTER — Encounter: Payer: Self-pay | Admitting: Surgery

## 2020-01-03 ENCOUNTER — Ambulatory Visit (INDEPENDENT_AMBULATORY_CARE_PROVIDER_SITE_OTHER): Payer: Managed Care, Other (non HMO) | Admitting: Obstetrics and Gynecology

## 2020-01-03 ENCOUNTER — Encounter: Payer: Self-pay | Admitting: Obstetrics and Gynecology

## 2020-01-03 ENCOUNTER — Other Ambulatory Visit: Payer: Self-pay

## 2020-01-03 VITALS — BP 110/80 | Ht 61.0 in | Wt 155.0 lb

## 2020-01-03 DIAGNOSIS — N898 Other specified noninflammatory disorders of vagina: Secondary | ICD-10-CM

## 2020-01-03 DIAGNOSIS — Z1151 Encounter for screening for human papillomavirus (HPV): Secondary | ICD-10-CM | POA: Diagnosis not present

## 2020-01-03 DIAGNOSIS — Z01419 Encounter for gynecological examination (general) (routine) without abnormal findings: Secondary | ICD-10-CM | POA: Diagnosis not present

## 2020-01-03 DIAGNOSIS — Z30014 Encounter for initial prescription of intrauterine contraceptive device: Secondary | ICD-10-CM

## 2020-01-03 DIAGNOSIS — Z124 Encounter for screening for malignant neoplasm of cervix: Secondary | ICD-10-CM

## 2020-01-03 DIAGNOSIS — Z113 Encounter for screening for infections with a predominantly sexual mode of transmission: Secondary | ICD-10-CM | POA: Diagnosis not present

## 2020-01-03 NOTE — Progress Notes (Signed)
PCP:  [facility]   Chief Complaint  Patient presents with  . Gynecologic Exam  . Contraception    currently no BC, interested in IUD     HPI:      Ms. Katherine Berg is a 34 y.o. No obstetric history on file. whose LMP was Patient's last menstrual period was 12/22/2019 (exact date)., presents today for her NP annual examination.  Her menses are regular every 28-30 days, lasting 5 days.  Dysmenorrhea mild. She does not have intermenstrual bleeding.  Sex activity: single partner, contraception - none. Interested in IUD. Did OCPs in past with nausea and doesn't want daily method. Has had some dyspareunia past few months.  Last Pap: 2 yrs ago and was "abnormal"; no hx of bx. Wants STD testing.  Has had a couple genital lesions over the past month, going to sauna but wears underwear. Concerned about them. One popped with bloody d/c.  There is no FH of breast cancer. There is no FH of ovarian cancer. The patient does not do self-breast exams. Her dad had colon cancer age 44. Genetic testing not indicated for pt but will start colonoscopies age 68.   Tobacco use: occas Alcohol use: none No drug use.  Exercise: moderately active  She does get adequate calcium and Vitamin D in her diet.    Upstream - 01/03/20 1509      Pregnancy Intention Screening   Does the patient want to become pregnant in the next year? No    Does the patient's partner want to become pregnant in the next year? No    Would the patient like to discuss contraceptive options today? Yes      Contraception Wrap Up   Current Method No Method - Other Reason          The pregnancy intention screening data noted above was reviewed. Potential methods of contraception were discussed. The patient elected to proceed with IUD or IUS.    Past Medical History:  Diagnosis Date  . Umbilical hernia   . Vitamin D deficiency     Past Surgical History:  Procedure Laterality Date  .  ESOPHAGOGASTRODUODENOSCOPY Left 03/21/2018   Procedure: ESOPHAGOGASTRODUODENOSCOPY (EGD);  Surgeon: Pasty Spillers, MD;  [facility] ENDOSCOPY;  Service: Endoscopy;  Laterality: Left;  . UMBILICAL HERNIA REPAIR N/A 05/16/2018   Procedure: OPEN HERNIA REPAIR UMBILICAL ADULT;  Surgeon: Leafy Ro, MD;  Location: [facility] ORS;  Service: General;  Laterality: N/A;    Family History  Problem Relation Age of Onset  . Colon cancer Father 2  . Breast cancer Neg Hx     Social History   Socioeconomic History  . Marital status: Married    Spouse name: Not on file  . Number of children: Not on file  . Years of education: Not on file  . Highest education level: Not on file  Occupational History  . Not on file  Tobacco Use  . Smoking status: Never Smoker  . Smokeless tobacco: Never Used  Vaping Use  . Vaping Use: Never used  Substance and Sexual Activity  . Alcohol use: Yes    Comment: occassional  . Drug use: Never  . Sexual activity: Yes    Birth control/protection: None  Other Topics Concern  . Not on file  Social History Narrative  . Not on file   Social Determinants of Health   Financial Resource Strain:   . Difficulty of Paying Living Expenses: Not on file  Food Insecurity:   . Worried About Programme researcher, broadcasting/film/video in the Last Year: Not on file  . Ran Out of Food in the Last Year: Not on file  Transportation Needs:   . Lack of Transportation (Medical): Not on file  . Lack of Transportation (Non-Medical): Not on file  Physical Activity:   . Days of Exercise per Week: Not on file  . Minutes of Exercise per Session: Not on file  Stress:   . Feeling of Stress : Not on file  Social Connections:   . Frequency of Communication with Friends and Family: Not on file  . Frequency of Social Gatherings with Friends and Family: Not on file  . Attends Religious Services: Not on file  . Active Member of Clubs or Organizations: Not on file  . Attends Banker  Meetings: Not on file  . Marital Status: Not on file  Intimate Partner Violence:   . Fear of Current or Ex-Partner: Not on file  . Emotionally Abused: Not on file  . Physically Abused: Not on file  . Sexually Abused: Not on file     Current Outpatient Medications:  .  Cholecalciferol (VITAMIN D3 PO), Take 1 capsule by mouth daily., Disp: , Rfl:      ROS:  Review of Systems  Constitutional: Negative for fatigue, fever and unexpected weight change.  Respiratory: Negative for cough, shortness of breath and wheezing.   Cardiovascular: Negative for chest pain, palpitations and leg swelling.  Gastrointestinal: Negative for blood in stool, constipation, diarrhea, nausea and vomiting.  Endocrine: Negative for cold intolerance, heat intolerance and polyuria.  Genitourinary: Positive for dyspareunia. Negative for dysuria, flank pain, frequency, genital sores, hematuria, menstrual problem, pelvic pain, urgency, vaginal bleeding, vaginal discharge and vaginal pain.  Musculoskeletal: Negative for back pain, joint swelling and myalgias.  Skin: Negative for rash.  Neurological: Negative for dizziness, syncope, light-headedness, numbness and headaches.  Hematological: Negative for adenopathy.  Psychiatric/Behavioral: Negative for agitation, confusion, sleep disturbance and suicidal ideas. The patient is not nervous/anxious.   BREAST: No symptoms   Objective: BP 110/80   Ht 5\' 1"  (1.549 m)   Wt 155 lb (70.3 kg)   LMP 12/22/2019 (Exact Date)   BMI 29.29 kg/m    Physical Exam Constitutional:      Appearance: She is well-developed.  Genitourinary:     Vulva, vagina, cervix, uterus, right adnexa and left adnexa normal.     No vulval lesion or tenderness noted.        No vaginal discharge, erythema or tenderness.     No cervical polyp.     Uterus is not enlarged or tender.     No right or left adnexal mass present.     Right adnexa not tender.     Left adnexa not tender.      Genitourinary Comments: 2 RESOLVING LESIONS; C/W "PIMPLES"; NO WARTY OR ULCERATIVE LESIONS  Neck:     Thyroid: No thyromegaly.  Cardiovascular:     Rate and Rhythm: Normal rate and regular rhythm.     Heart sounds: Normal heart sounds. No murmur heard.   Pulmonary:     Effort: Pulmonary effort is normal.     Breath sounds: Normal breath sounds.  Chest:     Breasts:        Right: No mass, nipple discharge, skin change or tenderness.        Left: No mass, nipple discharge, skin change or tenderness.  Abdominal:  Palpations: Abdomen is soft.     Tenderness: There is no abdominal tenderness. There is no guarding.  Musculoskeletal:        General: Normal range of motion.     Cervical back: Normal range of motion.  Neurological:     General: No focal deficit present.     Mental Status: She is alert and oriented to person, place, and time.     Cranial Nerves: No cranial nerve deficit.  Skin:    General: Skin is warm and dry.  Psychiatric:        Mood and Affect: Mood normal.        Behavior: Behavior normal.        Thought Content: Thought content normal.        Judgment: Judgment normal.  Vitals reviewed.     Assessment/Plan: Encounter for annual routine gynecological examination  Cervical cancer screening - Plan: IGP,CtNgTv,Apt HPV,rfx16/18,45,   Screening for STD (sexually transmitted disease) - Plan: IGP,CtNgTv,Apt HPV,rfx16/18,45  Screening for HPV (human papillomavirus) - Plan: IGP,CtNgTv,Apt HPV,rfx16/18,45,   Encounter for initial prescription of intrauterine contraceptive device (IUD); IUD pros/cons/risks/benefits discussed. Pt to RTO for Mirena insertion with next menses. NSAIDs, handout given.  Vaginal lesion--resolving "pimples". Reassurance. F/u prn.            GYN counsel family planning choices, adequate intake of calcium and vitamin D, diet and exercise     F/U  Return in about 1 year (around 01/02/2021).  Saralyn Willison B. Ein Rijo, PA-C 01/03/2020 3:40 PM

## 2020-01-03 NOTE — Patient Instructions (Signed)
I value your feedback and entrusting us with your care. If you get a [facility] patient survey, I would appreciate you taking the time to let us know about your experience today. Thank you!  As of April 05, 2019, your lab results will be released to your MyChart immediately, before I even have a chance to see them. Please give me time to review them and contact you if there are any abnormalities. Thank you for your patience.  

## 2020-01-07 LAB — IGP,CTNGTV,APT HPV,RFX16/18,45
Chlamydia, Nuc. Acid Amp: NEGATIVE
Gonococcus, Nuc. Acid Amp: NEGATIVE
HPV Aptima: NEGATIVE
Trich vag by NAA: NEGATIVE

## 2020-01-23 ENCOUNTER — Telehealth: Payer: Self-pay | Admitting: Obstetrics and Gynecology

## 2020-01-23 NOTE — Telephone Encounter (Signed)
Patient scheduled 10/5 for Mirena placement with ABC.

## 2020-01-25 NOTE — Telephone Encounter (Signed)
Noted. Mirena reserved for this patient. 

## 2020-01-29 ENCOUNTER — Encounter: Payer: Self-pay | Admitting: Obstetrics and Gynecology

## 2020-01-29 ENCOUNTER — Other Ambulatory Visit: Payer: Self-pay

## 2020-01-29 ENCOUNTER — Ambulatory Visit (INDEPENDENT_AMBULATORY_CARE_PROVIDER_SITE_OTHER): Payer: Managed Care, Other (non HMO) | Admitting: Obstetrics and Gynecology

## 2020-01-29 VITALS — BP 110/80 | Ht 61.0 in | Wt 143.0 lb

## 2020-01-29 DIAGNOSIS — Z30430 Encounter for insertion of intrauterine contraceptive device: Secondary | ICD-10-CM | POA: Diagnosis not present

## 2020-01-29 MED ORDER — MIRENA (52 MG) 20 MCG/24HR IU IUD: 1.0000 | INTRAUTERINE_SYSTEM | Freq: Once | INTRAUTERINE | 0 refills | Status: AC

## 2020-01-29 NOTE — Progress Notes (Signed)
   Chief Complaint  Patient presents with  . Contraception    Mirena insertion     IUD PROCEDURE NOTE:  Katherine Berg is a 34 y.o. [id number] here for Mirena  IUD insertion for Katherine Berg. Neg pap/STD testing at 9/21 annual. No sex activity since LMP.    BP 110/80   Ht 5\' 1"  (1.549 m)   Wt 143 lb (64.9 kg)   LMP 01/20/2020 (Exact Date)   BMI 27.02 kg/m   IUD Insertion Procedure Note Patient identified, informed consent performed, consent signed.   Discussed risks of irregular bleeding, cramping, infection, malpositioning or misplacement of the IUD outside the uterus which may require further procedure such as laparoscopy, risk of failure <1%. Time out was performed.    Speculum placed in the vagina.  Cervix visualized.  Cleaned with Betadine x 2.  Grasped anteriorly with a single tooth tenaculum.  Uterus sounded to 8.0 cm.   IUD placed per manufacturer's recommendations.  Strings trimmed to 3 cm. Tenaculum was removed, good hemostasis noted.  Patient tolerated procedure well.   ASSESSMENT:  Encounter for IUD insertion - Plan: levonorgestrel (MIRENA, 52 MG,) 20 MCG/24HR IUD   Meds ordered this encounter  Medications  . levonorgestrel (MIRENA, 52 MG,) 20 MCG/24HR IUD    Sig: 1 Intra Uterine Device (1 each total) by Intrauterine route once for 1 dose.    Dispense:  1 Intra Uterine Device    Refill:  0    Order Specific Question:   Supervising Provider    Answer:   01/22/2020 Nadara Mustard     Plan:  Patient was given post-procedure instructions.  She was advised to have backup contraception for one week.   Call if you are having increasing pain, cramps or bleeding or if you have a fever greater than 100.4 degrees F., shaking chills, nausea or vomiting. Patient was also asked to check IUD strings periodically and follow up in 4 weeks for IUD check.  Return in about 4 weeks (around 02/26/2020) for IUD f/u.  Amardeep Beckers B. Cherith Tewell, PA-C 01/29/2020 3:14 PM

## 2020-01-29 NOTE — Patient Instructions (Signed)
I value your feedback and entrusting us with your care. If you get a [facility] patient survey, I would appreciate you taking the time to let us know about your experience today. Thank you!  As of April 05, 2019, your lab results will be released to your MyChart immediately, before I even have a chance to see them. Please give me time to review them and contact you if there are any abnormalities. Thank you for your patience.   [facility] [phone]  Instructions after IUD insertion  Most women experience no significant problems after insertion of an IUD, however minor cramping and spotting for a few days is common. Cramps may be treated with ibuprofen 800mg every 8 hours or Tylenol 650 mg every 4 hours. Contact [facility] immediately if you experience any of the following symptoms during the next week: temperature >99.6 degrees, worsening pelvic pain, abdominal pain, fainting, unusually heavy vaginal bleeding, foul vaginal discharge, or if you think you have expelled the IUD.  Nothing inserted in the vagina for 48 hours. You will be scheduled for a follow up visit in approximately four weeks.  You should check monthly to be sure you can feel the IUD strings in the upper vagina. If you are having a monthly period, try to check after each period. If you cannot feel the IUD strings,  contact [facility] immediately so we can do an exam to determine if the IUD has been expelled.   Please use backup protection until we can confirm the IUD is in place.  Call [facility] if you are exposed to or diagnosed with a sexually transmitted infection, as we will need to discuss whether it is safe for you to continue using an IUD.   

## 2020-02-18 ENCOUNTER — Encounter: Payer: Self-pay | Admitting: Certified Nurse Midwife

## 2020-02-26 ENCOUNTER — Ambulatory Visit: Payer: Managed Care, Other (non HMO) | Admitting: Obstetrics and Gynecology

## 2021-02-20 NOTE — Telephone Encounter (Signed)
Mirena rcvd/charged 01/29/20

## 2023-11-22 ENCOUNTER — Other Ambulatory Visit: Payer: Self-pay | Admitting: Medical Genetics

## 2023-11-29 ENCOUNTER — Emergency Department
Admission: EM | Admit: 2023-11-29 | Discharge: 2023-11-29 | Disposition: A | Discharge status: Home / Self Care | Payer: Self-pay | Attending: Emergency Medicine | Admitting: Emergency Medicine

## 2023-11-29 ENCOUNTER — Emergency Department: Payer: Self-pay

## 2023-11-29 DIAGNOSIS — M7582 Other shoulder lesions, left shoulder: Secondary | ICD-10-CM

## 2023-11-29 DIAGNOSIS — M778 Other enthesopathies, not elsewhere classified: Secondary | ICD-10-CM | POA: Insufficient documentation

## 2023-11-29 MED ORDER — CYCLOBENZAPRINE HCL 5 MG PO TABS: 5.0000 mg | ORAL_TABLET | Freq: Three times a day (TID) | ORAL | 0 refills | Status: AC | PRN

## 2023-11-29 MED ORDER — NAPROXEN 500 MG PO TABS: 500.0000 mg | ORAL_TABLET | Freq: Two times a day (BID) | ORAL | 0 refills | Status: AC

## 2023-11-29 MED ORDER — TRIAMCINOLONE ACETONIDE 40 MG/ML IJ SUSP
40.0000 mg | Freq: Once | INTRAMUSCULAR | Status: DC
  Filled 2023-11-29: qty 1
  Filled 2023-11-29 (×2): qty 4

## 2023-11-29 MED ORDER — PENTAFLUOROPROP-TETRAFLUOROETH EX AERO
INHALATION_SPRAY | CUTANEOUS | Status: DC | PRN
  Filled 2023-11-29: qty 30

## 2023-11-29 MED ORDER — LIDOCAINE HCL (PF) 1 % IJ SOLN
5.0000 mL | Freq: Once | INTRAMUSCULAR | Status: DC
  Filled 2023-11-29: qty 5

## 2023-11-29 NOTE — ED Provider Notes (Signed)
 [facility] Emergency Department Provider Note     Event Date/Time   First MD Initiated Contact with Patient 11/29/23 1558     (approximate)   History   Shoulder Pain and Neck Pain   HPI  Katherine Berg is a 38 y.o. female patient with noncontributory medical history presents to the ED endorsing left shoulder pain.  She reports referral of that same a shoulder pain into her left neck and the base of the skull.  She reports 2 weeks of intermittent symptoms patient [place] any preceding injury, trauma, or fall.  She would also endorse intermittent weakness to the left arm in terms of range and grip strength.  No chest pain or shortness of breath reported.  Physical Exam   Triage Vital Signs: ED Triage Vitals  Encounter Vitals Group     BP 11/29/23 1436 (!) 148/111     Girls Systolic BP Percentile --      Girls Diastolic BP Percentile --      Boys Systolic BP Percentile --      Boys Diastolic BP Percentile --      Pulse Rate 11/29/23 1431 69     Resp 11/29/23 1431 18     Temp 11/29/23 1431 98.3 F (36.8 C)     Temp Source 11/29/23 1431 Oral     SpO2 11/29/23 1431 99 %     Weight 11/29/23 1436 175 lb (79.4 kg)     Height 11/29/23 1436 5' 1 (1.549 m)     Head Circumference --      Peak Flow --      Pain Score 11/29/23 1436 7     Pain Loc --      Pain Education --      Exclude from Growth Chart --     Most recent vital signs: Vitals:   11/29/23 1436 11/29/23 1656  BP: (!) 148/111   Pulse:    Resp:    Temp:    SpO2:  99%    General Awake, no distress. NAD HEENT NCAT. PERRL. EOMI. No rhinorrhea. Mucous membranes are moist. CV:  Good peripheral perfusion.  RESP:  Normal effort.  MSK:  Left shoulder without obvious deformity, dislocation, or sulcus sign.  Patient with decreased abduction and extension range to about 100 degrees.  Rotator cuff testing is intact but weak and with empty can and supraspinatus testing due to pain.  Normal  composite fist distally. NEURO: Cranial nerves II to XII grossly intact.   ED Results / Procedures / Treatments   Labs (all labs ordered are listed, but only abnormal results are displayed) Labs Reviewed - No data to display   EKG   RADIOLOGY  I personally viewed and evaluated these images as part of my medical decision making, as well as reviewing the written report by the radiologist.  ED Provider Interpretation: No acute fracture or dislocation seen: Questionable calcific soft tissue within the subacromial space  DG Shoulder Left Result Date: 11/29/2023 EXAM: 1 VIEW XRAY OF THE LEFT SHOULDER 11/29/2023 02:47:50 PM COMPARISON: None available. CLINICAL HISTORY: Pain and knot. Complains of increasing pain in left shoulder radiating into left neck and base of skull for 2 weeks. Pain score 7/10. Patient reports intermittent weakness in left arm. Patient reports feeling a knot in left collarbone area. FINDINGS: BONES AND JOINTS: Glenohumeral joint is normally aligned. No acute fracture or dislocation. The [facility] joint is unremarkable in appearance. SOFT TISSUES: No abnormal calcifications. Possible curvilinear soft  tissue calcification superior to the humeral head versus overlying artifact. No radiographic explanation for knot. IMPRESSION: 1. Possible soft tissue calcification superior to the humeral head versus overlying artifact. 2. No osseous abnormality. Electronically signed by: Andrea Gasman MD 11/29/2023 03:19 PM EDT RP Workstation: [id number]     PROCEDURES:  Critical Care performed: No  Injection of joint  Date/Time: 11/29/2023 6:13 PM  Performed by: Loyd Candida LULLA Aldona, PA-C Authorized by: Loyd Candida LULLA Aldona, PA-C  Consent: Verbal consent obtained Risks and benefits: risks, benefits and alternatives were discussed Consent given by: patient Patient understanding: patient states understanding of the procedure being performed Patient consent: the patient's  understanding of the procedure matches consent given Procedure consent: procedure consent matches procedure scheduled Test results: test results available and properly labeled Site marked: the operative site was marked Imaging studies: imaging studies available Required items: required blood products, implants, devices, and special equipment available Patient identity confirmed: verbally with patient Preparation: Patient was prepped and draped in the usual sterile fashion. Local anesthesia used: yes Anesthesia: local infiltration  Anesthesia: Local anesthesia used: yes Local Anesthetic: lidocaine  1% without epinephrine  Anesthetic total: 0.5 mL  Sedation: Patient sedated: no  Patient tolerance: patient tolerated the procedure well with no immediate complications Comments: 5 cc total mixture of 4 cc lidocaine  without epi +40 mg (1 cc) of Kenalog  injected into the left subacromial space with a posterior approach.  Patient with improved range of motion following procedure.    MEDICATIONS ORDERED IN ED: Medications  pentafluoroprop-tetrafluoroeth (GEBAUERS) aerosol (has no administration in time range)  lidocaine  (PF) (XYLOCAINE ) 1 % injection 5 mL (has no administration in time range)  triamcinolone  acetonide (KENALOG -40) injection 40 mg (has no administration in time range)    IMPRESSION / MDM / ASSESSMENT AND PLAN / ED COURSE  I reviewed the triage vital signs and the nursing notes.                              Differential diagnosis includes, but is not limited to, cervical radiculopathy, shoulder arthropathy, tendinitis, bursitis  Patient's presentation is most consistent with acute complicated illness / injury requiring diagnostic workup.  Patient's diagnosis is consistent with left rotator cuff tendinitis and bursitis.  Patient presents with 2 weeks of persistent left shoulder pain and disability.  She noted active range of motion limited by pain to about 90 degrees of  extension and abduction.  No prior history of shoulder injury or ongoing chronic shoulder problems.  X-ray reviewed by me, reveals no acute arthropathy, but possible calcification within the subacromial space.  Patient consented to a shoulder injection including lidocaine  and Kenalog .  Procedure was performed sterilely after patient was prepped and draped and placed in a seated position.  Good smooth instillation of the steroid/lidocaine  solution into the subacromial space with a posterior approach.  Postprocedure, patient without complaint, noting increased range of motion decrease pain.  Patient will be discharged home with prescriptions for naproxen  and Flexeril . Patient is to follow up with the orthopedics as needed or otherwise directed. Patient is given ED precautions to return to the ED for any worsening or new symptoms.  FINAL CLINICAL IMPRESSION(S) / ED DIAGNOSES   Final diagnoses:  Rotator cuff tendinitis, left     Rx / DC Orders   ED Discharge Orders     None        Note:  This document was prepared using Dragon voice recognition software  and may include unintentional dictation errors.    Loyd Candida LULLA Aldona, PA-C 11/29/23 1837    Claudene Rover, MD 11/29/23 [phone]

## 2023-11-29 NOTE — Discharge Instructions (Signed)
 Your exam and x-rays are normal and reassuring.  Your x-ray may show some mild calcific tendinitis within the acromion space.  You have consented to or received a steroid injection into the shoulder joint.  You can expect increased range and decrease discomfort in the shoulder over the next few days.  Continue to apply ice to help reduce swelling.  Perform low impact range of motion exercises as demonstrated.  Take the prescription anti-inflammatory as directed and the muscle relaxant as needed.  Follow-up with orthopedics for ongoing evaluation.  Return to the ED if needed.

## 2023-11-29 NOTE — ED Triage Notes (Addendum)
 Pt c/o increasing pain in L shoulder radiating into L neck and base of skull x2 weeks.  Pain score 7/10.  Pt reports intermittent weakness in L arm.  Pt reports feeling a knot in L collarbone area.

## 2024-02-04 ENCOUNTER — Other Ambulatory Visit: Payer: Self-pay | Admitting: Medical Genetics

## 2024-02-04 DIAGNOSIS — Z006 Encounter for examination for normal comparison and control in clinical research program: Secondary | ICD-10-CM

## 2024-02-29 LAB — GENECONNECT MOLECULAR SCREEN: Genetic Analysis Overall Interpretation: NEGATIVE
